# Patient Record
Sex: Male | Born: 1970 | Race: White | Hispanic: Yes | Smoking: Never smoker
Health system: Southern US, Community
[De-identification: ages and names within clinical notes are randomized; demographics above are authoritative.]

## PROBLEM LIST (undated history)

## (undated) DIAGNOSIS — F419 Anxiety disorder, unspecified: Secondary | ICD-10-CM

## (undated) DIAGNOSIS — T7840XA Allergy, unspecified, initial encounter: Secondary | ICD-10-CM

## (undated) DIAGNOSIS — K219 Gastro-esophageal reflux disease without esophagitis: Secondary | ICD-10-CM

## (undated) HISTORY — PX: UPPER GASTROINTESTINAL ENDOSCOPY: SHX188

## (undated) HISTORY — PX: FINGER SURGERY: SHX640

## (undated) HISTORY — DX: Allergy, unspecified, initial encounter: T78.40XA

## (undated) HISTORY — DX: Gastro-esophageal reflux disease without esophagitis: K21.9

## (undated) HISTORY — DX: Anxiety disorder, unspecified: F41.9

## (undated) HISTORY — PX: OTHER SURGICAL HISTORY: SHX169

---

## 2017-08-02 ENCOUNTER — Encounter: Payer: Self-pay | Admitting: Gastroenterology

## 2018-09-15 ENCOUNTER — Emergency Department (HOSPITAL_COMMUNITY): Payer: Managed Care, Other (non HMO)

## 2018-09-15 ENCOUNTER — Encounter (HOSPITAL_COMMUNITY): Payer: Self-pay | Admitting: Emergency Medicine

## 2018-09-15 ENCOUNTER — Emergency Department (HOSPITAL_COMMUNITY)
Admission: EM | Admit: 2018-09-15 | Discharge: 2018-09-15 | Disposition: A | Discharge status: Home / Self Care | Payer: Managed Care, Other (non HMO) | Attending: Emergency Medicine | Admitting: Emergency Medicine

## 2018-09-15 DIAGNOSIS — W231XXA Caught, crushed, jammed, or pinched between stationary objects, initial encounter: Secondary | ICD-10-CM | POA: Insufficient documentation

## 2018-09-15 DIAGNOSIS — Z23 Encounter for immunization: Secondary | ICD-10-CM | POA: Diagnosis not present

## 2018-09-15 DIAGNOSIS — Y929 Unspecified place or not applicable: Secondary | ICD-10-CM | POA: Diagnosis not present

## 2018-09-15 DIAGNOSIS — Y939 Activity, unspecified: Secondary | ICD-10-CM | POA: Insufficient documentation

## 2018-09-15 DIAGNOSIS — S60411A Abrasion of left index finger, initial encounter: Secondary | ICD-10-CM | POA: Insufficient documentation

## 2018-09-15 DIAGNOSIS — T148XXA Other injury of unspecified body region, initial encounter: Secondary | ICD-10-CM

## 2018-09-15 DIAGNOSIS — Y998 Other external cause status: Secondary | ICD-10-CM | POA: Insufficient documentation

## 2018-09-15 DIAGNOSIS — S6992XA Unspecified injury of left wrist, hand and finger(s), initial encounter: Secondary | ICD-10-CM | POA: Diagnosis present

## 2018-09-15 DIAGNOSIS — S60022A Contusion of left index finger without damage to nail, initial encounter: Secondary | ICD-10-CM | POA: Insufficient documentation

## 2018-09-15 MED ORDER — TETANUS-DIPHTH-ACELL PERTUSSIS 5-2.5-18.5 LF-MCG/0.5 IM SUSP
0.5000 mL | Freq: Once | INTRAMUSCULAR | Status: AC
  Administered 2018-09-15: 0.5 mL via INTRAMUSCULAR
  Filled 2018-09-15: qty 0.5

## 2018-09-15 NOTE — ED Triage Notes (Signed)
Pt states grill lid slammed down on L index finger just pta.

## 2018-09-15 NOTE — ED Provider Notes (Signed)
[facility] EMERGENCY DEPARTMENT Provider Note   CSN: [id number] Arrival date & time: 09/15/18  1536     History   Chief Complaint Chief Complaint  Patient presents with  . Finger Injury    HPI Benjamin Ho is a 48 y.o. male.  Patient is a 48 year old male who presents with a finger injury.  He states he slammed his left index finger in a grill lid just prior to arrival which was about an hour ago.  He has an abrasion to the finger and throbbing pain.  He feels like it is throbbing him and it feels a little numb because it swollen but denies any other numbness or weakness.  He is unsure when his last tetanus shot was.     History reviewed. No pertinent past medical history.  There are no active problems to display for this patient.   History reviewed. No pertinent surgical history.      Home Medications    Prior to Admission medications   Not on File    Family History No family history on file.  Social History Social History   Tobacco Use  . Smoking status: Not on file  Substance Use Topics  . Alcohol use: Not on file  . Drug use: Not on file     Allergies   Patient has no allergy information on record.   Review of Systems Review of Systems  Constitutional: Negative for fever.  Gastrointestinal: Negative for nausea and vomiting.  Musculoskeletal: Positive for arthralgias and joint swelling. Negative for back pain and neck pain.  Skin: Positive for wound.  Neurological: Negative for weakness, numbness and headaches.     Physical Exam Updated Vital Signs BP (!) 141/93 (BP Location: Right Arm)   Pulse 77   Temp 98.1 F (36.7 C) (Oral)   Resp 16   Ht 5\' 8"  (1.727 m)   Wt 79.8 kg   SpO2 96%   BMI 26.76 kg/m   Physical Exam Constitutional:      Appearance: He is well-developed.  HENT:     Head: Normocephalic and atraumatic.  Neck:     Musculoskeletal: Normal range of motion and neck supple.    Cardiovascular:     Rate and Rhythm: Normal rate.  Pulmonary:     Effort: Pulmonary effort is normal.  Musculoskeletal:        General: Tenderness present.     Comments: Patient has mild swelling to the distal part of the left index finger overlying the middle phalanx.  The injury is around the DIP joint.  He has a small abrasion on the volar surface of the finger and a small abrasion on the dorsal surface.  There is no active bleeding.  He is able to flex and extend the finger including the distal phalanx without difficulty.  He has some mildly diminished sensation to light touch to the pad of the finger.  Capillary refill is less than 2.  There is no apparent nail involvement.  Skin:    General: Skin is warm and dry.  Neurological:     Mental Status: He is alert and oriented to person, place, and time.      ED Treatments / Results  Labs (all labs ordered are listed, but only abnormal results are displayed) Labs Reviewed - No data to display  EKG None  Radiology Dg Finger Index Left  Result Date: 09/15/2018 CLINICAL DATA:  Laceration. EXAM: LEFT INDEX FINGER 2+V COMPARISON:  None. FINDINGS:  There is no evidence of fracture or dislocation. There is no evidence of arthropathy or other focal bone abnormality. Soft tissues are unremarkable. IMPRESSION: Negative. Electronically Signed   By: Dorise Bullion III M.D   On: 09/15/2018 17:48    Procedures Procedures (including critical care time)  Medications Ordered in ED Medications  Tdap (BOOSTRIX) injection 0.5 mL (0.5 mLs Intramuscular Given 09/15/18 1658)     Initial Impression / Assessment and Plan / ED Course  I have reviewed the triage vital signs and the nursing notes.  Pertinent labs & imaging results that were available during my care of the patient were reviewed by me and considered in my medical decision making (see chart for details).     Patient is a 48 year old male who presents with a finger injury.  He has some  small abrasions on both sides of his fingers with small superficial appearing laceration.  There is no bony injury noted on x-ray.  No nail injury.  He was given wound care instructions and advised to return if he has any worsening symptoms.  Final Clinical Impressions(s) / ED Diagnoses   Final diagnoses:  Contusion of left index finger without damage to nail, initial encounter  Abrasion    ED Discharge Orders    None       Malvin Johns, MD 09/15/18 1819

## 2018-09-15 NOTE — ED Notes (Signed)
Pt back from X-ray.  

## 2019-04-18 ENCOUNTER — Encounter: Payer: Self-pay | Admitting: Gastroenterology

## 2019-04-23 ENCOUNTER — Other Ambulatory Visit: Payer: Self-pay | Admitting: Internal Medicine

## 2019-04-23 DIAGNOSIS — E663 Overweight: Secondary | ICD-10-CM

## 2019-04-30 ENCOUNTER — Other Ambulatory Visit: Payer: Self-pay | Admitting: Internal Medicine

## 2019-04-30 DIAGNOSIS — E663 Overweight: Secondary | ICD-10-CM

## 2019-04-30 DIAGNOSIS — K219 Gastro-esophageal reflux disease without esophagitis: Secondary | ICD-10-CM

## 2019-04-30 DIAGNOSIS — K824 Cholesterolosis of gallbladder: Secondary | ICD-10-CM

## 2019-05-02 ENCOUNTER — Ambulatory Visit
Admission: RE | Admit: 2019-05-02 | Discharge: 2019-05-02 | Disposition: A | Discharge status: Home / Self Care | Payer: Managed Care, Other (non HMO) | Source: Ambulatory Visit | Attending: Internal Medicine | Admitting: Internal Medicine

## 2019-05-02 DIAGNOSIS — K219 Gastro-esophageal reflux disease without esophagitis: Secondary | ICD-10-CM

## 2019-05-02 DIAGNOSIS — K824 Cholesterolosis of gallbladder: Secondary | ICD-10-CM

## 2019-05-02 DIAGNOSIS — E663 Overweight: Secondary | ICD-10-CM

## 2019-05-19 ENCOUNTER — Other Ambulatory Visit: Payer: Self-pay

## 2019-05-19 ENCOUNTER — Ambulatory Visit: Payer: Managed Care, Other (non HMO) | Admitting: Gastroenterology

## 2019-05-19 ENCOUNTER — Encounter: Payer: Self-pay | Admitting: Gastroenterology

## 2019-05-19 VITALS — BP 120/90 | HR 80 | Temp 98.5°F | Ht 67.5 in | Wt 181.0 lb

## 2019-05-19 DIAGNOSIS — K824 Cholesterolosis of gallbladder: Secondary | ICD-10-CM

## 2019-05-19 DIAGNOSIS — K219 Gastro-esophageal reflux disease without esophagitis: Secondary | ICD-10-CM

## 2019-05-19 NOTE — Progress Notes (Signed)
Referring Provider: Sueanne Margarita, DO Primary Care Physician:  Sueanne Margarita, DO  Reason for Consultation:  GERD   IMPRESSION:  GERD controlled on PPI therapy Gallbladder polyps History of H. pylori 2011 No prior colon cancer screening  Reflux is adequately controlled on PPI therapy but the patient would like to avoid medications over time.  We briefly discussed the possibility of a transoral incision less fundoplication (TIF).  He would like to obtain additional information on I will arrange referral with Dr. Bryan Lemma.  May need to consider additional evaluation of his reflux prior to that procedure.  No upper endoscopy, pH testing, manometry, or barium esophagram planned at this time as it would not change my management unless TIF becomes a possibility.  Although his known gallbladder polyp appeared unchanged for several years, on recent testing it is slightly larger than it was last year which was even slightly larger than in 2018.  We will repeat an ultrasound in 1 year.  Colonoscopy recommended for colon cancer screening given his age over 18.  We discussed the procedure in depth including risks, benefits, and alternatives.  He specifically asked about Cologuard.  He will discuss cost with his insurance company.  I have told him he could call to schedule the procedure at any time.  PLAN: Prevacid 15 mg daily (3 month supply with 3 refills) Reviewed reflux lifestyles Referral to Dr. Bryan Lemma to discuss transoral incisionless fundoplication (TIF) Repeat abdominal ultrasound in October 2021 Consider screening colonoscopy Return appointment in one year, or earlier as needed   Please see the "Patient Instructions" section for addition details about the plan.  HPI: Benjamin Ho is a 48 y.o. male referred by Dr. Francesco Sor for gallbladder polyp and reflux. Originally from [place].  History is obtained to the patient, review of his electronic health record, and records  that we obtained from his prior gastroenterologist in [place].  He has a history of migraines, anixety, and low back pain.  Identified with a gallbladder polyp 9 years ago in [place], [place]. Continued to have surveillance of the polyp after a move to [place]. Relocated to [place] in 2019. And he is working to get established with a gastroenterologist locally.  He has no symptoms that he attributes to the polyp.  Longstanding history of Prevacid OTC for many years with adequate control of her heartburn.  Initially presented with epigastric pain.  In 2011 he required both Prevacid and Zantac for symptom escalation after an upper endoscopy showed reflux and H. pylori.  Nexium and Zantac monotherapy did not work as well for his reflux. Without medications he will have heartburn and significant coughing.  He has wanted to discontinue the medication. No evidence for GI bleeding, iron deficiency anemia, anorexia, unexplained weight loss, dysphagia, odynophagia, persistent vomiting, or gastrointestinal cancer in a first-degree relative.  No chronic cough, laryngeal disease, or asthma.  Has significant stress at work and he develops a raspy throat and the sensation of broken glass. Resolves with Tums and deep breathing.    H pylori stool antigen submitted last week. Results are not available.   Abdominal imaging: Abdominal ultrasound 03/07/2012: Normal no gallbladder polyps Abdominal ultrasound 2/12: 2 small gallbladder polyps Abdominal ultrasound 11/2013: 2 small gallbladder polyps Abdominal ultrasound 07/2015: 2 small gallbladder polyps Abdominal ultrasound 07/2016: 2 mm gallbladder polyp Abdominal ultrasound 08/02/2017: 3 mm gallbladder polyp and mild splenomegaly Abdominal ultrasound 05/02/19: 33mm gallbladder polyp, no acute abnormalities  Prior endoscopic history: EGD in 2011 in [place], [place]  for reflux. Diagnosed with H pylori.  No known family history of colon cancer or polyps.  No family history of uterine/endometrial cancer, pancreatic cancer or gastric/stomach cancer.   Past Medical History:  Diagnosis Date  . Anxiety     Past Surgical History:  Procedure Laterality Date  . mallet finger     pinky right hand    Current Outpatient Medications  Medication Sig Dispense Refill  . fluticasone (FLONASE) 50 MCG/ACT nasal spray Place 1 spray into both nostrils at bedtime.    . Lansoprazole (PREVACID 24HR PO) Take 1 tablet by mouth daily.    . vitamin B-12 (CYANOCOBALAMIN) 1000 MCG tablet Take 1,000 mcg by mouth daily.     No current facility-administered medications for this visit.     Allergies as of 05/19/2019 - Review Complete 05/19/2019  Allergen Reaction Noted  . Cefadroxil Swelling and Rash 05/19/2019    Family History  Problem Relation Age of Onset  . Diabetes Mother   . Hypertension Father   . Thyroid disease Sister   . Lung cancer Maternal Grandfather        smoker  . Cancer Paternal Grandfather        stomach or intestinal    Social History   Socioeconomic History  . Marital status: Significant Other    Spouse name: Not on file  . Number of children: 0  . Years of education: Not on file  . Highest education level: Not on file  Occupational History  . Occupation: Engineer, materials  . Financial resource strain: Not on file  . Food insecurity    Worry: Not on file    Inability: Not on file  . Transportation needs    Medical: Not on file    Non-medical: Not on file  Tobacco Use  . Smoking status: Never Smoker  . Smokeless tobacco: Never Used  Substance and Sexual Activity  . Alcohol use: Never    Frequency: Never  . Drug use: Never  . Sexual activity: Not on file  Lifestyle  . Physical activity    Days per week: Not on file    Minutes per session: Not on file  . Stress: Not on file  Relationships  . Social Herbalist on phone: Not on file    Gets together: Not on file    Attends religious service: Not  on file    Active member of club or organization: Not on file    Attends meetings of clubs or organizations: Not on file    Relationship status: Not on file  . Intimate partner violence    Fear of current or ex partner: Not on file    Emotionally abused: Not on file    Physically abused: Not on file    Forced sexual activity: Not on file  Other Topics Concern  . Not on file  Social History Narrative  . Not on file    Review of Systems: 12 system ROS is negative except as noted above.   Physical Exam: General:   Alert,  well-nourished, pleasant and cooperative in NAD Head:  Normocephalic and atraumatic. Eyes:  Sclera clear, no icterus.   Conjunctiva pink. Ears:  Normal auditory acuity. Nose:  No deformity, discharge,  or lesions. Mouth:  No deformity or lesions.   Neck:  Supple; no masses or thyromegaly. Lungs:  Clear throughout to auscultation.   No wheezes. Heart:  Regular rate and rhythm; no murmurs. Abdomen:  Soft, thin, nontender,  nondistended, normal bowel sounds, no rebound or guarding. No hepatosplenomegaly.   Rectal:  Deferred  Msk:  Symmetrical. No boney deformities LAD: No inguinal or umbilical LAD Extremities:  No clubbing or edema. Neurologic:  Alert and  oriented x4;  grossly nonfocal Skin:  Intact without significant lesions or rashes. Psych:  Alert and cooperative. Normal mood and affect.     Vinessa Macconnell L. Tarri Glenn, MD, MPH 05/19/2019, 9:18 AM

## 2019-05-19 NOTE — Patient Instructions (Addendum)
Take your Prevacid 30 minutes before meals Avoid any dietary triggers Avoid spicy and acidic foods Limit your intake of coffee, tea, alcohol, and carbonated drinks Work to maintain a healthy weight Keep the head of the bed elevated with blocks if you are having any nighttime symptoms Stay upright for 2 hours after eating Avoid meals and snacks three to four hours before bedtime   I would like for you to discuss TIF with Dr. Melanee Left. I gave you a brochure about the procedure today.  You should plan to have an ultrasound in one year to follow-up on the gallbladder polyp.  I currently recommend colon cancer screening starting at age 47. Please talk to your insurance company to see if this is approved and how much it would cost. Call at any time to schedule the procedure.   We will plan to follow-up after the ultrasound in one year. Please call me with any questions or concerns in the meantime.

## 2019-06-19 ENCOUNTER — Ambulatory Visit: Payer: Managed Care, Other (non HMO) | Admitting: Gastroenterology

## 2019-06-23 ENCOUNTER — Telehealth (INDEPENDENT_AMBULATORY_CARE_PROVIDER_SITE_OTHER): Payer: Managed Care, Other (non HMO) | Admitting: Gastroenterology

## 2019-06-23 ENCOUNTER — Other Ambulatory Visit: Payer: Self-pay

## 2019-06-23 VITALS — Ht 68.0 in | Wt 180.0 lb

## 2019-06-23 DIAGNOSIS — Z1159 Encounter for screening for other viral diseases: Secondary | ICD-10-CM

## 2019-06-23 DIAGNOSIS — K824 Cholesterolosis of gallbladder: Secondary | ICD-10-CM

## 2019-06-23 DIAGNOSIS — Z1211 Encounter for screening for malignant neoplasm of colon: Secondary | ICD-10-CM

## 2019-06-23 DIAGNOSIS — K219 Gastro-esophageal reflux disease without esophagitis: Secondary | ICD-10-CM

## 2019-06-23 NOTE — Patient Instructions (Signed)
It has been recommended to you by your physician that you have a(n) upper endoscopy and colonoscopy completed. Per your request, we did not schedule the procedure(s) today. Please contact our office at [phone] should you decide to have the procedure completed. You will be scheduled for a pre-visit and procedure at that time.  Follow up in 3-6 months  It was a pleasure to see you today!  Vito Cirigliano, D.O.

## 2019-06-23 NOTE — Progress Notes (Signed)
Chief Complaint: GERD, evaluation for Transoral Incisionless Fundoplication (TIF)  Referring Provider:    Thornton Park, MD   HPI:    Due to current restrictions/limitations of in-office visits due to the COVID-19 pandemic, this scheduled clinical appointment was converted to a telehealth virtual consultation using [employer].  -Time of medical discussion: 28 minutes -The patient did consent to this virtual visit and is aware of possible charges through their insurance for this visit.  -Names of all parties present: Benjamin Ho (patient), Gerrit Heck, DO, [facility] (physician) -Patient location: Home -Physician location: Office  Benjamin Ho is a 48 y.o. male referred to me by Dr. Tarri Glenn for evaluation of possible antireflux intervention with Transoral Incisionless Fundoplication (TIF) with a goal to stop or significantly reduce acid suppression therapy.  Reflux symptoms adequately controlled with PPI therapy, but he would like to avoid medications due to ADR profile.  Previously followed with GI in [place], for GERD, with EGD in 2011 as below. Treated for H pylori at that time. Has been taking Prevacid since then, which initially worked well, but lost efficacy. No improvement with Nexium, Zantac, and has since reverted back to Prevacid QAM pre-breakfast. Generally works well, and takes Tums prn for breakthrough sxs (usually d/t dietary indiscretions), but wants to stop medications. Stopped coffee 10 years ago d/t reflux, and all caffeine 3 months ago. Never smoker/never drinker. Worse w/ chocolate, occasionally tomato based sauces. Rare nocturnal sxs.   GERD history: -Index symptoms: Heartburn, regurgitation, waterbrash, cough, raspy voice; occasional globus sensation; no dysphagia -Onset: Symptoms present since at least 2011 -Medications trialed: Prevacid OTC, Zantac, Nexium, Tums -Current medications: Prevacid 50 mg/day, Tums prn  -Complications:   GERD evaluation: -EGD: 2011 ([place]) -reported reflux changes and H. Pylori. No report in EMR -Barium esophagram: None -Esophageal Manometry: None -pH/Impedance: None   He separately has a history of asymptomatic GB polyp, first diagnosed 9 years ago in [place].  Continues to have surveillance of the polyp after he moved to [place].  Relocated to [facility] in 2019, and repeat ultrasound with GB polyp slightly enlarged.  Follows for this with Dr. Tarri Glenn, with plan for repeat abdominal ultrasound in 2021.  Abdominal imaging: Abdominal ultrasound 03/07/2012: Normal no gallbladder polyps Abdominal ultrasound 2/12: 2 small gallbladder polyps Abdominal ultrasound 11/2013: 2 small gallbladder polyps Abdominal ultrasound 07/2015: 2 small gallbladder polyps Abdominal ultrasound 07/2016: 2 mm gallbladder polyp Abdominal ultrasound 08/02/2017: 3 mm gallbladder polyp and mild splenomegaly Abdominal ultrasound 05/02/19: 36mm gallbladder polyp, no acute abnormalities  No prior colonoscopy.  Past medical history, past surgical history, social history, family history, medications, and allergies reviewed in the chart and with patient.    Past Medical History:  Diagnosis Date  . Anxiety      Past Surgical History:  Procedure Laterality Date  . mallet finger     pinky right hand   Family History  Problem Relation Age of Onset  . Diabetes Mother   . Hypertension Father   . Thyroid disease Sister   . Lung cancer Maternal Grandfather        smoker  . Cancer Paternal Grandfather        stomach or intestinal  . Cancer Cousin        intestinal   . Esophageal cancer Neg Hx    Social History   Tobacco Use  . Smoking status: Never Smoker  . Smokeless tobacco: Never  Used  Substance Use Topics  . Alcohol use: Never    Frequency: Never  . Drug use: Never   Current Outpatient Medications  Medication Sig Dispense Refill  . fluticasone (FLONASE) 50 MCG/ACT  nasal spray Place 1 spray into both nostrils at bedtime.    . Lansoprazole (PREVACID 24HR PO) Take 1 tablet by mouth daily.    . vitamin B-12 (CYANOCOBALAMIN) 1000 MCG tablet Take 1,000 mcg by mouth daily.     No current facility-administered medications for this visit.    Allergies  Allergen Reactions  . Cefadroxil Swelling and Rash     Review of Systems: All systems reviewed and negative except where noted in HPI.     Physical Exam:    Complete physical exam not completed due to the nature of this telehealth communication.   Gen: Awake, alert, and oriented, and well communicative. HEENT: EOMI, non-icteric sclera, NCAT, MMM Neck: Normal movement of head and neck Pulm: No labored breathing, speaking in full sentences without conversational dyspnea Derm: No apparent lesions or bruising in visible field MS: Moves all visible extremities without noticeable abnormality Psych: Pleasant, cooperative, normal speech, thought processing seemingly intact   ASSESSMENT AND PLAN;   1) GERD Benjamin Ho is a 48 y.o. male referred to me to discuss GERD and treatment options. Discussed the pathophysiology of GERD at length, to include the risks of untreated reflux (ie, strictures, Barrett's Esophagus, EAC, etc) as well as the possible treatment with medications vs antireflux surgery. In particular, we discussed the risks, benefits, and alternatives of Transoral Incisionless Fundoplication (TIF), to include Nissen fundoplication and Linx, and the patient wishes to proceed with the pre-operative evaluation for TIF. Will proceed as below:   - EGD to evaluate for objective e/o reflux (ie, reflux esophagitis, SSND Barrett's, etc) and r/o contraindications of TIF (ie, large hiatal hernia, Hill classification 3 or higher, LA Grade C or D esophagitis, EoE, etc)  - Resume acid suppression therapy for now  - If necessary, may need to refer for pH/MII testing vs barium esophagram -  Directed patient to informative video regarding the TIF procedure at [email or web address]  - To follow-up with me in the GI clinic following evaluation as above to discuss potential TIF as indicated vs alternate long term treatment strategies - All questions answered  - Will obtain recent lab results from Dr. Luanna Salk office, specifically BMP and any micronutrient evaluation given chronic acid suppression therapy -Previous request already made for EGD records from [facility]ologist  2) CRC screening: -Due to newly released societal guidelines, CRC screening now recommended at age 47.  Given plan for upper endoscopy as above, will plan on colonoscopy same day for routine, age-appropriate, average risk CRC screening.  3) History of GB polyp: -Follows with Dr. Tarri Glenn.  Plan for repeat ultrasound in 2021.  The indications, risks, and benefits of EGD and colonoscopy were explained to the patient in detail. Risks include but are not limited to bleeding, perforation, adverse reaction to medications, and cardiopulmonary compromise. Sequelae include but are not limited to the possibility of surgery, hositalization, and mortality. The patient verbalized understanding and wished to proceed. All questions answered, referred to scheduler and bowel prep ordered. Further recommendations pending results of the exam.    Lavena Bullion, DO, FACG  06/23/2019, 8:58 AM   Sueanne Margarita, DO

## 2019-08-04 ENCOUNTER — Telehealth: Payer: Self-pay

## 2019-08-04 NOTE — Telephone Encounter (Signed)
-----   Message from Angie Fava, LPN sent at [phone]  2:50 PM EST ----- Regarding: EGD colon ECL in [facility]

## 2019-08-04 NOTE — Telephone Encounter (Signed)
I have been unable to reach patient by phone to schedule a colonoscopy/EGD. A letter was mailed out for patient to contact the office.

## 2019-10-06 ENCOUNTER — Other Ambulatory Visit: Payer: Self-pay

## 2019-10-06 ENCOUNTER — Ambulatory Visit (AMBULATORY_SURGERY_CENTER): Payer: Self-pay

## 2019-10-06 VITALS — Temp 97.1°F | Ht 67.5 in | Wt 183.0 lb

## 2019-10-06 DIAGNOSIS — K219 Gastro-esophageal reflux disease without esophagitis: Secondary | ICD-10-CM

## 2019-10-06 DIAGNOSIS — Z01818 Encounter for other preprocedural examination: Secondary | ICD-10-CM

## 2019-10-06 DIAGNOSIS — Z1211 Encounter for screening for malignant neoplasm of colon: Secondary | ICD-10-CM

## 2019-10-06 MED ORDER — NA SULFATE-K SULFATE-MG SULF 17.5-3.13-1.6 GM/177ML PO SOLN: 1.0000 | Freq: Once | ORAL | 0 refills | Status: AC

## 2019-10-06 NOTE — Progress Notes (Signed)

## 2019-10-16 ENCOUNTER — Encounter: Payer: Self-pay | Admitting: Gastroenterology

## 2019-10-16 ENCOUNTER — Ambulatory Visit (INDEPENDENT_AMBULATORY_CARE_PROVIDER_SITE_OTHER): Payer: Managed Care, Other (non HMO)

## 2019-10-16 ENCOUNTER — Other Ambulatory Visit: Payer: Self-pay | Admitting: Gastroenterology

## 2019-10-16 DIAGNOSIS — Z1159 Encounter for screening for other viral diseases: Secondary | ICD-10-CM

## 2019-10-17 LAB — SARS CORONAVIRUS 2 (TAT 6-24 HRS): SARS Coronavirus 2: NEGATIVE

## 2019-10-20 ENCOUNTER — Other Ambulatory Visit: Payer: Self-pay

## 2019-10-20 ENCOUNTER — Encounter: Payer: Self-pay | Admitting: Gastroenterology

## 2019-10-20 ENCOUNTER — Ambulatory Visit (AMBULATORY_SURGERY_CENTER): Payer: Managed Care, Other (non HMO) | Admitting: Gastroenterology

## 2019-10-20 VITALS — BP 110/65 | HR 58 | Temp 96.8°F | Resp 14 | Ht 67.5 in | Wt 183.0 lb

## 2019-10-20 DIAGNOSIS — Z1211 Encounter for screening for malignant neoplasm of colon: Secondary | ICD-10-CM

## 2019-10-20 DIAGNOSIS — D123 Benign neoplasm of transverse colon: Secondary | ICD-10-CM

## 2019-10-20 DIAGNOSIS — K635 Polyp of colon: Secondary | ICD-10-CM | POA: Diagnosis not present

## 2019-10-20 DIAGNOSIS — D128 Benign neoplasm of rectum: Secondary | ICD-10-CM

## 2019-10-20 DIAGNOSIS — K621 Rectal polyp: Secondary | ICD-10-CM

## 2019-10-20 DIAGNOSIS — K640 First degree hemorrhoids: Secondary | ICD-10-CM

## 2019-10-20 DIAGNOSIS — K2970 Gastritis, unspecified, without bleeding: Secondary | ICD-10-CM

## 2019-10-20 DIAGNOSIS — K219 Gastro-esophageal reflux disease without esophagitis: Secondary | ICD-10-CM

## 2019-10-20 DIAGNOSIS — K2950 Unspecified chronic gastritis without bleeding: Secondary | ICD-10-CM

## 2019-10-20 DIAGNOSIS — K2990 Gastroduodenitis, unspecified, without bleeding: Secondary | ICD-10-CM

## 2019-10-20 MED ORDER — SODIUM CHLORIDE 0.9 % IV SOLN: 500.0000 mL | Freq: Once | INTRAVENOUS | Status: DC

## 2019-10-20 NOTE — Op Note (Signed)
[facility] Patient Name: Benjamin Ho Procedure Date: 10/20/2019 7:26 AM MRN: [id number] Endoscopist: Gerrit Heck , MD Age: 49 Referring MD:  Date of Birth: [birth year] Gender: Male Account #: [id number] Procedure:                Upper GI endoscopy Indications:              Heartburn, Esophageal reflux, Preoperative                            assessment for antireflux procedure Medicines:                Monitored Anesthesia Care Procedure:                Pre-Anesthesia Assessment:                           - Prior to the procedure, a History and Physical                            was performed, and patient medications and                            allergies were reviewed. The patient's tolerance of                            previous anesthesia was also reviewed. The risks                            and benefits of the procedure and the sedation                            options and risks were discussed with the patient.                            All questions were answered, and informed consent                            was obtained. Prior Anticoagulants: The patient has                            taken no previous anticoagulant or antiplatelet                            agents. ASA Grade Assessment: II - A patient with                            mild systemic disease. After reviewing the risks                            and benefits, the patient was deemed in                            satisfactory condition to undergo the procedure.  After obtaining informed consent, the endoscope was                            passed under direct vision. Throughout the                            procedure, the patient's blood pressure, pulse, and                            oxygen saturations were monitored continuously. The                            Endoscope was introduced through the mouth, and                            advanced to the  second part of duodenum. The upper                            GI endoscopy was accomplished without difficulty.                            The patient tolerated the procedure well. Scope In: Scope Out: Findings:                 The examined esophagus was normal.                           The Z-line was regular and was found 38 cm from the                            incisors.                           The gastroesophageal flap valve was visualized                            endoscopically and classified as Hill Grade III                            (minimal fold, loose to endoscope, hiatal hernia                            likely) with LES laxity noted on anterograde and                            retroflexed views.                           Scattered mild inflammation characterized by                            erythema was found in the gastric fundus, in the                            gastric body and in the gastric antrum.  Biopsies                            were taken with a cold forceps for Helicobacter                            pylori testing. Estimated blood loss was minimal.                           The duodenal bulb, first portion of the duodenum                            and second portion of the duodenum were normal. Complications:            No immediate complications. Estimated Blood Loss:     Estimated blood loss was minimal. Impression:               - Normal esophagus.                           - Z-line regular, 38 cm from the incisors.                           - Gastroesophageal flap valve classified as Hill                            Grade III (minimal fold, loose to endoscope, hiatal                            hernia likely).                           - Gastritis. Biopsied.                           - Normal duodenal bulb, first portion of the                            duodenum and second portion of the duodenum. Recommendation:           - Patient has a contact  number available for                            emergencies. The signs and symptoms of potential                            delayed complications were discussed with the                            patient. Return to normal activities tomorrow.                            Written discharge instructions were provided to the                            patient.                           -  Resume previous diet.                           - Continue present medications.                           - Await pathology results.                           - Perform an esophagram at appointment to be                            scheduled.                           - Return to GI clinic after studies are complete.                           - Given the degree of LES laxity and Hill Grade 3                            valve, if planning on antireflux surgery, recommend                            combined lap hiatal hernia repair/crural repair and                            Transoral Incisionless Fundoplication, ie cTIF. Gerrit Heck, MD 10/20/2019 8:40:06 AM

## 2019-10-20 NOTE — Progress Notes (Signed)
Called to room to assist during endoscopic procedure.  Patient ID and intended procedure confirmed with present staff. Received instructions for my participation in the procedure from the performing physician.  

## 2019-10-20 NOTE — Op Note (Signed)
[facility] Patient Name: Benjamin Ho Procedure Date: 10/20/2019 7:25 AM MRN: [id number] Endoscopist: Gerrit Heck , MD Age: 49 Referring MD:  Date of Birth: 21-May-1971 Gender: Male Account #: [id number] Procedure:                Colonoscopy Indications:              Screening for colorectal malignant neoplasm, This                            is the patient's first colonoscopy Medicines:                Monitored Anesthesia Care Procedure:                Pre-Anesthesia Assessment:                           - Prior to the procedure, a History and Physical                            was performed, and patient medications and                            allergies were reviewed. The patient's tolerance of                            previous anesthesia was also reviewed. The risks                            and benefits of the procedure and the sedation                            options and risks were discussed with the patient.                            All questions were answered, and informed consent                            was obtained. Prior Anticoagulants: The patient has                            taken no previous anticoagulant or antiplatelet                            agents. ASA Grade Assessment: II - A patient with                            mild systemic disease. After reviewing the risks                            and benefits, the patient was deemed in                            satisfactory condition to undergo the procedure.  After obtaining informed consent, the colonoscope                            was passed under direct vision. Throughout the                            procedure, the patient's blood pressure, pulse, and                            oxygen saturations were monitored continuously. The                            Colonoscope was introduced through the anus and                            advanced to the the  terminal ileum. The colonoscopy                            was performed without difficulty. The patient                            tolerated the procedure well. The quality of the                            bowel preparation was excellent. The terminal                            ileum, ileocecal valve, appendiceal orifice, and                            rectum were photographed. Scope In: 8:14:38 AM Scope Out: 8:29:13 AM Scope Withdrawal Time: 0 hours 13 minutes 28 seconds  Total Procedure Duration: 0 hours 14 minutes 35 seconds  Findings:                 The perianal and digital rectal examinations were                            normal.                           A 6 mm polyp was found in the transverse colon. The                            polyp was sessile. The polyp was removed with a                            cold snare. Resection and retrieval were complete.                            Estimated blood loss was minimal.                           One 4 mm mucosal nodule was found in the distal  rectum. Tunnelled biopsies were taken via                            bite-on-bite technique with a cold forceps for                            histology. Estimated blood loss was minimal.                           Non-bleeding internal hemorrhoids were found during                            retroflexion. The hemorrhoids were small.                           Retroflexion in the right colon was performed.                           The exam was otherwise normal throughout the                            examined colon.                           The terminal ileum appeared normal. Complications:            No immediate complications. Estimated Blood Loss:     Estimated blood loss was minimal. Impression:               - One 6 mm polyp in the transverse colon, removed                            with a cold snare. Resected and retrieved.                           -  Mucosal nodule in the distal rectum. Biopsied.                           - Non-bleeding internal hemorrhoids.                           - The examined portion of the ileum was normal. Recommendation:           - Patient has a contact number available for                            emergencies. The signs and symptoms of potential                            delayed complications were discussed with the                            patient. Return to normal activities tomorrow.                            Written discharge instructions were provided  to the                            patient.                           - Resume previous diet.                           - Continue present medications.                           - Await pathology results.                           - Repeat colonoscopy for surveillance based on                            pathology results.                           - Depending on results of the rectal nodule biopsy,                            may need to further evaluate with Endoscopic                            Ultrasound as appropriate. Gerrit Heck, MD 10/20/2019 8:44:08 AM

## 2019-10-20 NOTE — Progress Notes (Signed)
Temp by JB Vitals by CW  Pt's states no medical or surgical changes since previsit or office visit.  

## 2019-10-20 NOTE — Progress Notes (Signed)
Report given to PACU, vss 

## 2019-10-20 NOTE — Patient Instructions (Signed)
Information on polyps given to you today.  Await pathology results.  Perform esophagram at appointment  to be scheduled.  Return to GI clinic after studies are complete.  YOU HAD AN ENDOSCOPIC PROCEDURE TODAY AT [facility]:   Refer to the procedure report that was given to you for any specific questions about what was found during the examination.  If the procedure report does not answer your questions, please call your gastroenterologist to clarify.  If you requested that your care partner not be given the details of your procedure findings, then the procedure report has been included in a sealed envelope for you to review at your convenience later.  YOU SHOULD EXPECT: Some feelings of bloating in the abdomen. Passage of more gas than usual.  Walking can help get rid of the air that was put into your GI tract during the procedure and reduce the bloating. If you had a lower endoscopy (such as a colonoscopy or flexible sigmoidoscopy) you may notice spotting of blood in your stool or on the toilet paper. If you underwent a bowel prep for your procedure, you may not have a normal bowel movement for a few days.  Please Note:  You might notice some irritation and congestion in your nose or some drainage.  This is from the oxygen used during your procedure.  There is no need for concern and it should clear up in a day or so.  SYMPTOMS TO REPORT IMMEDIATELY:   Following lower endoscopy (colonoscopy or flexible sigmoidoscopy):  Excessive amounts of blood in the stool  Significant tenderness or worsening of abdominal pains  Swelling of the abdomen that is new, acute  Fever of 100F or higher   Following upper endoscopy (EGD)  Vomiting of blood or coffee ground material  New chest pain or pain under the shoulder blades  Painful or persistently difficult swallowing  New shortness of breath  Fever of 100F or higher  Black, tarry-looking stools  For urgent or emergent issues, a  gastroenterologist can be reached at any hour by calling [phone]. Do not use MyChart messaging for urgent concerns.    DIET:  We do recommend a small meal at first, but then you may proceed to your regular diet.  Drink plenty of fluids but you should avoid alcoholic beverages for 24 hours.  ACTIVITY:  You should plan to take it easy for the rest of today and you should NOT DRIVE or use heavy machinery until tomorrow (because of the sedation medicines used during the test).    FOLLOW UP: Our staff will call the number listed on your records 48-72 hours following your procedure to check on you and address any questions or concerns that you may have regarding the information given to you following your procedure. If we do not reach you, we will leave a message.  We will attempt to reach you two times.  During this call, we will ask if you have developed any symptoms of COVID 19. If you develop any symptoms (ie: fever, flu-like symptoms, shortness of breath, cough etc.) before then, please call [phone].  If you test positive for Covid 19 in the 2 weeks post procedure, please call and report this information to [place].    If any biopsies were taken you will be contacted by phone or by letter within the next 1-3 weeks.  Please call us at [phone] if you have not heard about the biopsies in 3 weeks.    SIGNATURES/CONFIDENTIALITY:  You and/or your care partner have signed paperwork which will be entered into your electronic medical record.  These signatures attest to the fact that that the information above on your After Visit Summary has been reviewed and is understood.  Full responsibility of the confidentiality of this discharge information lies with you and/or your care-partner.

## 2019-10-21 ENCOUNTER — Other Ambulatory Visit: Payer: Self-pay

## 2019-10-21 DIAGNOSIS — K219 Gastro-esophageal reflux disease without esophagitis: Secondary | ICD-10-CM

## 2019-10-21 DIAGNOSIS — R12 Heartburn: Secondary | ICD-10-CM

## 2019-10-21 NOTE — Progress Notes (Signed)
Per EGD/colon procedure report- the patient is to be scheduled for an esophagram - Patient has been scheduled at [facility] on 03/26 /2021 arrival 0845, appt at 0900, NPO after 0600; patient given number to call [phone] -in case of need to reschedule or cancel appt; Patient advised to call back to the office at [phone] should questions/concerns arise;  Patient verbalized understanding of information/instructions;

## 2019-10-22 ENCOUNTER — Telehealth: Payer: Self-pay | Admitting: *Deleted

## 2019-10-22 NOTE — Telephone Encounter (Signed)
1. Have you developed a fever since your procedure?  no  2.   Have you had an respiratory symptoms (SOB or cough) since your procedure? no  3.   Have you tested positive for COVID 19 since your procedure no  4.   Have you had any family members/close contacts diagnosed with the COVID 19 since your procedure?  no   If yes to any of these questions please route to Joylene John, RN and Erenest Rasher, RN Follow up Call-  Call back number 10/20/2019  Post procedure Call Back phone  # [phone]  Permission to leave phone message Yes     Patient questions:  Do you have a fever, pain , or abdominal swelling? No. Pain Score  0 *  Have you tolerated food without any problems? Yes.    Have you been able to return to your normal activities? Yes.    Do you have any questions about your discharge instructions: Diet   No. Medications  No. Follow up visit  No.  Do you have questions or concerns about your Care? No.  Actions: * If pain score is 4 or above: No action needed, pain <4.

## 2019-10-22 NOTE — Telephone Encounter (Signed)
Message left on f/u call °

## 2019-10-24 ENCOUNTER — Other Ambulatory Visit: Payer: Self-pay

## 2019-10-24 ENCOUNTER — Other Ambulatory Visit: Payer: Self-pay | Admitting: Gastroenterology

## 2019-10-24 ENCOUNTER — Ambulatory Visit (HOSPITAL_COMMUNITY)
Admission: RE | Admit: 2019-10-24 | Discharge: 2019-10-24 | Disposition: A | Discharge status: Home / Self Care | Payer: Managed Care, Other (non HMO) | Source: Ambulatory Visit | Attending: Gastroenterology | Admitting: Gastroenterology

## 2019-10-24 DIAGNOSIS — K219 Gastro-esophageal reflux disease without esophagitis: Secondary | ICD-10-CM

## 2019-10-24 DIAGNOSIS — R12 Heartburn: Secondary | ICD-10-CM

## 2019-10-28 ENCOUNTER — Encounter: Payer: Self-pay | Admitting: Gastroenterology

## 2019-11-12 ENCOUNTER — Telehealth: Payer: Self-pay | Admitting: Gastroenterology

## 2019-11-12 NOTE — Telephone Encounter (Signed)
Patient is requesting results from patho report-letter resent to patient at this time per his request; Patient advised to call back to the office at [phone] should questions/concerns arise;  Patient verbalized understanding of information/instructions;

## 2019-11-20 ENCOUNTER — Encounter: Payer: Self-pay | Admitting: Gastroenterology

## 2019-11-20 ENCOUNTER — Ambulatory Visit (INDEPENDENT_AMBULATORY_CARE_PROVIDER_SITE_OTHER): Payer: Managed Care, Other (non HMO) | Admitting: Gastroenterology

## 2019-11-20 ENCOUNTER — Other Ambulatory Visit: Payer: Self-pay

## 2019-11-20 VITALS — BP 118/80 | HR 57 | Temp 96.9°F | Ht 68.5 in | Wt 186.1 lb

## 2019-11-20 DIAGNOSIS — K6289 Other specified diseases of anus and rectum: Secondary | ICD-10-CM

## 2019-11-20 DIAGNOSIS — K824 Cholesterolosis of gallbladder: Secondary | ICD-10-CM

## 2019-11-20 DIAGNOSIS — K219 Gastro-esophageal reflux disease without esophagitis: Secondary | ICD-10-CM

## 2019-11-20 DIAGNOSIS — Z86010 Personal history of colonic polyps: Secondary | ICD-10-CM | POA: Diagnosis not present

## 2019-11-20 DIAGNOSIS — K640 First degree hemorrhoids: Secondary | ICD-10-CM | POA: Diagnosis not present

## 2019-11-20 NOTE — Patient Instructions (Signed)
We have sent a referral to [facility] at [address] [place], [place] [address]. [phone]. You will be receiving a call to schedule an appointment with them soon. To see Dr Redmond Pulling  We will contact you to schedule you for an esophageal manometry you will need to be off your PPR for 7 days prior  Our office will contact you to schedule a lower EUS with Dr Rush Landmark or Dr Ardis Hughs.  It was a pleasure to see you today!  Vito Cirigliano, D.O.

## 2019-11-20 NOTE — Progress Notes (Signed)
P  Chief Complaint:    GERD, discuss antireflux surgical options (TIF), H. pylori follow-up  GI History: 49 year old male with a longstanding history of GERD, initially referred to me in 06/2019 by Dr. Tarri Glenn for consideration of TIF.  Previously followed with GI in [place], for GERD, with EGD in 2011 as below. Treated for H pylori at that time. Has been taking Prevacid since then, which initially worked well, but lost efficacy. No improvement with Nexium, Zantac, and has since reverted back to Prevacid QAM pre-breakfast. Generally works well, and takes Tums prn for breakthrough sxs (usually d/t dietary indiscretions), but wants to stop medications. Stopped coffee 10 years ago d/t reflux, and all caffeine in 2020. Never smoker/never drinker. Worse w/ chocolate, occasionally tomato based sauces. Rare nocturnal sxs.   GERD history: -Index symptoms: Heartburn, regurgitation, waterbrash, cough, raspy voice; occasional globus sensation; no dysphagia -Onset: Symptoms present since at least 2011 -Medications trialed: Prevacid OTC, Zantac, Nexium, Tums -Current medications: Prevacid 50 mg/day, Tums prn -Complications:   GERD evaluation: -EGD (09/2019, Dr. Bryan Lemma): Irregular Z-line, Hill grade 3 valve, mild gastritis -EGD: 2011 ([place]) -reported reflux changes and H. Pylori. No report in EMR -Barium esophagram (10/24/2019): Thickening esophageal folds c/w reflux, normal motility.  No hernia appreciated.  No reflux observed during study. -Esophageal Manometry:  Ordered today -pH/Impedance:  Ordered today  He separately has a history of asymptomatic GB polyp, first diagnosed 9 years ago in [place].  Continues to have surveillance of the polyp after he moved to [place].  Relocated to [facility] in 2019, and repeat ultrasound with GB polyp slightly enlarged.  Previously followed for this with Dr. Tarri Glenn, with plan for repeat abdominal ultrasound in 2021.  Abdominal  imaging: Abdominal ultrasound 03/07/2012: Normal no gallbladder polyps Abdominal ultrasound 2/12: 2 small gallbladder polyps Abdominal ultrasound 11/2013: 2 small gallbladder polyps Abdominal ultrasound 07/2015: 2 small gallbladder polyps Abdominal ultrasound 07/2016: 2 mm gallbladder polyp Abdominal ultrasound 08/02/2017: 3 mm gallbladder polyp and mild splenomegaly Abdominal ultrasound 05/02/19: 63mm gallbladder polyp, no acute abnormalities  -Colonoscopy (09/2019, Dr. Bryan Lemma): 6 mm sessile serrated polyp.  4 mm rectal nodule (biopsy: Benign mucosa), Internal hemorrhoids.  Normal TI  HPI:     Patient is a 49 y.o. male presenting to the Gastroenterology Clinic for follow-up to discuss results of recent EGD and colonoscopy, as outlined above.  Barium esophagram with thickened esophageal folds likely consistent with reflux, but otherwise normal.  No refluxate noted during the study, with plan for DM and pH/impedance if wanting to proceed with antireflux surgery.  Today, he states reflux still well controlled on current therapy, still very much interested in antireflux surgery as a means to stop or significantly reduce need for PPI therapy.  Still eating very healthy and maintaining active lifestyle.  GERD-HRQL Questionairre Score: 13/50 (on PPI)    Review of systems:     No chest pain, no SOB, no fevers, no urinary sx   Past Medical History:  Diagnosis Date  . Allergy    per pt  . Anxiety   . GERD (gastroesophageal reflux disease)    per pt    Patient's surgical history, family medical history, social history, medications and allergies were all reviewed in Epic    Current Outpatient Medications  Medication Sig Dispense Refill  . fluticasone (FLONASE) 50 MCG/ACT nasal spray Place 1 spray into both nostrils at bedtime.    . Lansoprazole (PREVACID 24HR PO) Take 1 tablet by mouth daily.    Marland Kitchen  vitamin B-12 (CYANOCOBALAMIN) 1000 MCG tablet Take 1,000 mcg by mouth daily.     No current  facility-administered medications for this visit.    Physical Exam:     BP 118/80   Pulse (!) 57   Temp (!) 96.9 F (36.1 C)   Ht 5' 8.5" (1.74 m)   Wt 186 lb 2 oz (84.4 kg)   BMI 27.89 kg/m   GENERAL:  Pleasant male in NAD PSYCH: : Cooperative, normal affect NEURO: Alert and oriented x 3, no focal neurologic deficits   IMPRESSION and PLAN:    1) GERD 2) Diaphragmatic defect -Recent EGD with normal Z-line, but Hill grade 3 valve with significant LES laxity, without significant axial height hiatal hernia.  Discussed these findings at length today.  High suspicion for anatomic dysfunction leading to reflux.  He is still very much interested in antireflux surgery as a means to stop acid suppression therapy and improved symptomatology.  -Esophageal manometry and pH/impedance testing off PPI therapy x7 days -Referral to Dr. Redmond Pulling at [facility] for consideration of diaphragmatic defect repair/crural repair and concomitant TIF (cTIF) -Follow-up after EM and pH/impedance complete -Long discussion regarding the risks, benefits of TIF, to include postoperative dietary/lifestyle/exercise limitations  3) Rectal nodule -Biopsies benign -Referral for rectal EUS  4) Sessile serrated polyp: -Repeat colonoscopy in 2026 for ongoing polyp surveillance  5) Internal hemorrhoids: -Asymptomatic -Resume current healthy eating with high-fiber diet  6) History of GB polyp: -Stable polyp on serial imaging studies.  Had previously planned for repeat ultrasound later this year.  Last ultrasound demonstrated 4 mm polyp.  Do not feel strongly that this needs to continue to be surveyed.      I spent 35 minutes of time, including in depth chart review, independent review of results as outlined above, communicating results with the patient directly, face-to-face time with the patient, coordinating care, and ordering studies and medications as appropriate, and documentation.       [place] ,DO,  FACG 11/20/2019, 10:08 AM

## 2019-11-21 ENCOUNTER — Telehealth: Payer: Self-pay

## 2019-11-21 ENCOUNTER — Other Ambulatory Visit: Payer: Self-pay

## 2019-11-21 DIAGNOSIS — K6289 Other specified diseases of anus and rectum: Secondary | ICD-10-CM

## 2019-11-21 NOTE — Telephone Encounter (Signed)
-----   Message from Irving Copas., MD sent at 11/21/2019  8:51 AM EDT ----- Regarding: RE: EUS Genae Strine, Non-urgent Rectal EUS with possible EMR with DJ or myself in the next few months to evaluate the rectal subepithelial lesion. Thanks. GM ----- Message ----- From: Timothy Lasso, RN Sent: 11/21/2019   8:13 AM EDT To: Milus Banister, MD, # Subject: FW: EUS                                        Thanks Claiborne Billings I will send this note to Dr Ardis Hughs and [place].  I will take care of it as soon as I get a response.   ----- Message ----- From: Angie Fava, LPN Sent: [id number]   7:55 AM EDT To: Timothy Lasso, RN Subject: EUS                                            Loui Massenburg Please refer to 11/20/19 office note regarding lower  EUS with either Dr Rush Landmark or Ardis Hughs.  Melany Guernsey

## 2019-11-21 NOTE — Telephone Encounter (Signed)
EUS scheduled, pt instructed and medications reviewed.  Patient instructions mailed to home.  Patient to call with any questions or concerns.  

## 2019-11-24 ENCOUNTER — Telehealth: Payer: Self-pay

## 2019-11-24 NOTE — Telephone Encounter (Signed)
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 Thanks. GM ----- Message ----- From: Timothy Lasso, RN Sent: 11/21/2019   8:13 AM EDT To: Milus Banister, MD, # Subject: FW: EUS                                        Thanks Claiborne Billings I will send this note to Dr Ardis Hughs and Mansouraty.  I will take care of it as soon as I get a response.   ----- Message ----- From: Angie Fava, LPN Sent: [id number]   7:55 AM EDT To: Timothy Lasso, RN Subject: EUS                                            Benjamin Ho Please refer to 11/20/19 office note regarding lower  EUS with either Dr Rush Landmark or Ardis Hughs.  Melany Guernsey

## 2019-11-24 NOTE — Telephone Encounter (Signed)
The pt has been scheduled for 6/14 and has been advised.

## 2019-12-06 ENCOUNTER — Other Ambulatory Visit (HOSPITAL_COMMUNITY): Payer: Managed Care, Other (non HMO)

## 2019-12-31 ENCOUNTER — Telehealth: Payer: Self-pay | Admitting: Gastroenterology

## 2020-01-01 NOTE — Telephone Encounter (Signed)
The pt called and states he would like to cancel the lower EUS and call back in a few weeks to reschedule.  The appt was cancelled.

## 2020-01-01 NOTE — Telephone Encounter (Signed)
Spoke to patient who had a few questions about rescheduling his EUS. I let him know that Chong Sicilian will be contacting him in June to schedule EUS for a date and time August with Dr Rush Landmark. All questions answered. Patient voiced understanding.

## 2020-01-02 NOTE — Telephone Encounter (Signed)
Thank you for update. Will most likely be in August at this time, but certainly OK based on the recent colonoscopy for that time period. Thanks. GM

## 2020-01-08 ENCOUNTER — Other Ambulatory Visit (HOSPITAL_COMMUNITY): Payer: Managed Care, Other (non HMO)

## 2020-01-12 ENCOUNTER — Encounter (HOSPITAL_COMMUNITY): Admission: RE | Payer: Self-pay | Source: Home / Self Care

## 2020-01-12 ENCOUNTER — Ambulatory Visit (HOSPITAL_COMMUNITY)
Admission: RE | Admit: 2020-01-12 | Payer: Managed Care, Other (non HMO) | Source: Home / Self Care | Admitting: Gastroenterology

## 2020-01-12 SURGERY — ULTRASOUND, LOWER GI TRACT, ENDOSCOPIC
Anesthesia: Monitor Anesthesia Care

## 2020-01-29 ENCOUNTER — Telehealth: Payer: Self-pay | Admitting: Gastroenterology

## 2020-01-29 NOTE — Telephone Encounter (Signed)
The pt will call back around the end of July to get procedure rescheduled with Dr Rush Landmark

## 2020-01-29 NOTE — Telephone Encounter (Signed)
Pt would like to r/s procedure with Dr. Rush Landmark, pls call him.

## 2020-02-13 ENCOUNTER — Telehealth: Payer: Self-pay

## 2020-02-13 NOTE — Telephone Encounter (Signed)
-----   Message from Angie Fava, LPN sent at 2/90/3795  7:57 AM EDT ----- Regarding: ESO mano ESO mano in August refer to 11/20/19 office note

## 2020-02-13 NOTE — Telephone Encounter (Signed)
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

## 2020-02-27 NOTE — Telephone Encounter (Signed)
Left message on machine to call back  

## 2020-02-27 NOTE — Telephone Encounter (Signed)
Pt is requesting a call back from Patty to reschedule his procedure

## 2020-03-01 ENCOUNTER — Other Ambulatory Visit: Payer: Self-pay

## 2020-03-01 DIAGNOSIS — K6289 Other specified diseases of anus and rectum: Secondary | ICD-10-CM

## 2020-03-01 DIAGNOSIS — K629 Disease of anus and rectum, unspecified: Secondary | ICD-10-CM

## 2020-03-01 NOTE — Telephone Encounter (Signed)
Left message on machine to call back  

## 2020-04-22 ENCOUNTER — Other Ambulatory Visit (HOSPITAL_COMMUNITY)
Admission: RE | Admit: 2020-04-22 | Discharge: 2020-04-22 | Disposition: A | Discharge status: Home / Self Care | Payer: Managed Care, Other (non HMO) | Source: Ambulatory Visit | Attending: Gastroenterology | Admitting: Gastroenterology

## 2020-04-22 DIAGNOSIS — Z01812 Encounter for preprocedural laboratory examination: Secondary | ICD-10-CM | POA: Insufficient documentation

## 2020-04-22 DIAGNOSIS — Z20822 Contact with and (suspected) exposure to covid-19: Secondary | ICD-10-CM | POA: Insufficient documentation

## 2020-04-22 LAB — SARS CORONAVIRUS 2 (TAT 6-24 HRS): SARS Coronavirus 2: NEGATIVE

## 2020-04-23 ENCOUNTER — Other Ambulatory Visit: Payer: Self-pay

## 2020-04-23 ENCOUNTER — Encounter (HOSPITAL_COMMUNITY): Payer: Self-pay | Admitting: Gastroenterology

## 2020-04-23 NOTE — Progress Notes (Signed)
Spoke with pt for pre-op call. Pt denies cardiac history, HTN or Diabetes.  Covid test done 04/22/20 and it's negative. Pt states he's been in quarantine since the test was done and understands to stay in quarantine until he comes to the hospital on Monday.

## 2020-04-26 ENCOUNTER — Ambulatory Visit (HOSPITAL_COMMUNITY)
Admission: RE | Admit: 2020-04-26 | Discharge: 2020-04-26 | Disposition: A | Discharge status: Home / Self Care | Payer: Managed Care, Other (non HMO) | Attending: Gastroenterology | Admitting: Gastroenterology

## 2020-04-26 ENCOUNTER — Other Ambulatory Visit: Payer: Self-pay

## 2020-04-26 ENCOUNTER — Ambulatory Visit (HOSPITAL_COMMUNITY): Payer: Managed Care, Other (non HMO) | Admitting: Certified Registered Nurse Anesthetist

## 2020-04-26 ENCOUNTER — Encounter (HOSPITAL_COMMUNITY)
Admission: RE | Disposition: A | Discharge status: Home / Self Care | Payer: Self-pay | Source: Home / Self Care | Attending: Gastroenterology

## 2020-04-26 ENCOUNTER — Encounter (HOSPITAL_COMMUNITY): Payer: Self-pay | Admitting: Gastroenterology

## 2020-04-26 DIAGNOSIS — I899 Noninfective disorder of lymphatic vessels and lymph nodes, unspecified: Secondary | ICD-10-CM

## 2020-04-26 DIAGNOSIS — K641 Second degree hemorrhoids: Secondary | ICD-10-CM | POA: Diagnosis not present

## 2020-04-26 DIAGNOSIS — K629 Disease of anus and rectum, unspecified: Secondary | ICD-10-CM

## 2020-04-26 DIAGNOSIS — K644 Residual hemorrhoidal skin tags: Secondary | ICD-10-CM | POA: Diagnosis not present

## 2020-04-26 DIAGNOSIS — K6289 Other specified diseases of anus and rectum: Secondary | ICD-10-CM

## 2020-04-26 DIAGNOSIS — Q439 Congenital malformation of intestine, unspecified: Secondary | ICD-10-CM | POA: Diagnosis present

## 2020-04-26 HISTORY — PX: FLEXIBLE SIGMOIDOSCOPY: SHX5431

## 2020-04-26 HISTORY — PX: EUS: SHX5427

## 2020-04-26 SURGERY — SIGMOIDOSCOPY, FLEXIBLE
Anesthesia: Monitor Anesthesia Care

## 2020-04-26 MED ORDER — MEPERIDINE HCL 100 MG/ML IJ SOLN: 6.2500 mg | INTRAMUSCULAR | Status: DC | PRN

## 2020-04-26 MED ORDER — ONDANSETRON HCL 4 MG/2ML IJ SOLN: 4.0000 mg | Freq: Once | INTRAMUSCULAR | Status: DC | PRN

## 2020-04-26 MED ORDER — PROPOFOL 10 MG/ML IV BOLUS
INTRAVENOUS | Status: DC | PRN
  Administered 2020-04-26: 20 mg via INTRAVENOUS
  Administered 2020-04-26: 30 mg via INTRAVENOUS
  Administered 2020-04-26: 25 mg via INTRAVENOUS
  Administered 2020-04-26: 100 ug/kg/min via INTRAVENOUS

## 2020-04-26 MED ORDER — LACTATED RINGERS IV SOLN: INTRAVENOUS | Status: DC

## 2020-04-26 MED ORDER — SODIUM CHLORIDE 0.9 % IV SOLN: INTRAVENOUS | Status: DC

## 2020-04-26 MED ORDER — HYDROMORPHONE HCL 1 MG/ML IJ SOLN: 0.2500 mg | INTRAMUSCULAR | Status: DC | PRN

## 2020-04-26 MED ORDER — PHENYLEPHRINE HCL (PRESSORS) 10 MG/ML IV SOLN
INTRAVENOUS | Status: DC | PRN
  Administered 2020-04-26 (×2): 80 ug via INTRAVENOUS

## 2020-04-26 MED ORDER — LIDOCAINE HCL (CARDIAC) PF 100 MG/5ML IV SOSY
PREFILLED_SYRINGE | INTRAVENOUS | Status: DC | PRN
  Administered 2020-04-26: 100 mg via INTRAVENOUS

## 2020-04-26 NOTE — H&P (Signed)
GASTROENTEROLOGY PROCEDURE H&P NOTE   Primary Care Physician: Sueanne Margarita, DO  HPI: Benjamin Ho is a 49 y.o. male who presents for Colonoscopy/Flexible Sigmoidoscopy with EUS and possible EMR or rectal nodule.  Past Medical History:  Diagnosis Date  . Allergy    per pt  . Anxiety   . GERD (gastroesophageal reflux disease)    per pt   Past Surgical History:  Procedure Laterality Date  . FINGER SURGERY    . mallet finger     pinky right hand  . UPPER GASTROINTESTINAL ENDOSCOPY     8-10 yrs ago per pt.   Current Facility-Administered Medications  Medication Dose Route Frequency Provider Last Rate Last Admin  . 0.9 %  sodium chloride infusion   Intravenous Continuous Mansouraty, Telford Nab., MD      . lactated ringers infusion   Intravenous Continuous Mansouraty, Telford Nab., MD 10 mL/hr at 04/26/20 0718 New Bag at 04/26/20 0762   Allergies  Allergen Reactions  . Cefadroxil Swelling and Rash   Family History  Problem Relation Age of Onset  . Diabetes Mother   . Hypertension Father   . Thyroid disease Sister   . Lung cancer Maternal Grandfather        smoker  . Cancer Paternal Grandfather        stomach or intestinal  . Cancer Cousin        colon vs stomach per pt  . Esophageal cancer Neg Hx   . Colon cancer Neg Hx   . Colon polyps Neg Hx   . Rectal cancer Neg Hx   . Stomach cancer Neg Hx    Social History   Socioeconomic History  . Marital status: Significant Other    Spouse name: Not on file  . Number of children: 0  . Years of education: Not on file  . Highest education level: Not on file  Occupational History  . Occupation: Finance  Tobacco Use  . Smoking status: Never Smoker  . Smokeless tobacco: Never Used  Vaping Use  . Vaping Use: Never used  Substance and Sexual Activity  . Alcohol use: Never  . Drug use: Never  . Sexual activity: Not on file  Other Topics Concern  . Not on file  Social History Narrative  . Not on  file   Social Determinants of Health   Financial Resource Strain:   . Difficulty of Paying Living Expenses: Not on file  Food Insecurity:   . Worried About Charity fundraiser in the Last Year: Not on file  . Ran Out of Food in the Last Year: Not on file  Transportation Needs:   . Lack of Transportation (Medical): Not on file  . Lack of Transportation (Non-Medical): Not on file  Physical Activity:   . Days of Exercise per Week: Not on file  . Minutes of Exercise per Session: Not on file  Stress:   . Feeling of Stress : Not on file  Social Connections:   . Frequency of Communication with Friends and Family: Not on file  . Frequency of Social Gatherings with Friends and Family: Not on file  . Attends Religious Services: Not on file  . Active Member of Clubs or Organizations: Not on file  . Attends Archivist Meetings: Not on file  . Marital Status: Not on file  Intimate Partner Violence:   . Fear of Current or Ex-Partner: Not on file  . Emotionally Abused: Not on file  .  Physically Abused: Not on file  . Sexually Abused: Not on file    Physical Exam: Vital signs in last 24 hours: Temp:  [98.9 F (37.2 C)] 98.9 F (37.2 C) (09/27 0648) Pulse Rate:  [84] 84 (09/27 0648) Resp:  [15] 15 (09/27 0648) BP: (118)/(80) 118/80 (09/27 0648) SpO2:  [95 %] 95 % (09/27 0648) Weight:  [81.6 kg] 81.6 kg (09/27 0648)   GEN: NAD EYE: Sclerae anicteric ENT: MMM CV: Non-tachycardic GI: Soft, NT/ND NEURO:  Alert & Oriented x 3  Lab Results: No results for input(s): WBC, HGB, HCT, PLT in the last 72 hours. BMET No results for input(s): NA, K, CL, CO2, GLUCOSE, BUN, CREATININE, CALCIUM in the last 72 hours. LFT No results for input(s): PROT, ALBUMIN, AST, ALT, ALKPHOS, BILITOT, BILIDIR, IBILI in the last 72 hours. PT/INR No results for input(s): LABPROT, INR in the last 72 hours.   Impression / Plan: This is a 49 y.o.male who presents for Colonoscopy/Flexible  Sigmoidoscopy with EUS and possible EMR or rectal nodule.  The risks of an EUS including intestinal perforation, bleeding, infection, aspiration, and medication effects were discussed as was the possibility it may not give a definitive diagnosis if a biopsy is performed.    The risks and benefits of endoscopic evaluation were discussed with the patient; these include but are not limited to the risk of perforation, infection, bleeding, missed lesions, lack of diagnosis, severe illness requiring hospitalization, as well as anesthesia and sedation related illnesses.  The patient is agreeable to proceed.    Justice Britain, MD [facility] Office # [phone]

## 2020-04-26 NOTE — Anesthesia Postprocedure Evaluation (Signed)
Anesthesia Post Note  Patient: [place]  Procedure(s) Performed: FLEXIBLE SIGMOIDOSCOPY (N/A ) LOWER ENDOSCOPIC ULTRASOUND (EUS) (N/A )     Patient location during evaluation: PACU Anesthesia Type: MAC Level of consciousness: awake and alert Pain management: pain level controlled Vital Signs Assessment: post-procedure vital signs reviewed and stable Respiratory status: spontaneous breathing, nonlabored ventilation, respiratory function stable and patient connected to nasal cannula oxygen Cardiovascular status: blood pressure returned to baseline and stable Postop Assessment: no apparent nausea or vomiting Anesthetic complications: no   No complications documented.  Last Vitals:  Vitals:   04/26/20 0910 04/26/20 0915  BP: 118/84 111/87  Pulse: (!) 57 (!) 53  Resp: 14 17  Temp: 36.5 C   SpO2: 99% 98%    Last Pain:  Vitals:   04/26/20 0910  TempSrc:   PainSc: 0-No pain                 Lyvia Mondesir DAVID

## 2020-04-26 NOTE — Transfer of Care (Signed)
Immediate Anesthesia Transfer of Care Note  Patient: Benjamin Ho  Procedure(s) Performed: FLEXIBLE SIGMOIDOSCOPY (N/A ) LOWER ENDOSCOPIC ULTRASOUND (EUS) (N/A )  Patient Location: PACU  Anesthesia Type:MAC  Level of Consciousness: drowsy  Airway & Oxygen Therapy: Patient Spontanous Breathing and Patient connected to nasal cannula oxygen  Post-op Assessment: Report given to RN, Post -op Vital signs reviewed and stable and Patient moving all extremities  Post vital signs: Reviewed and stable  Last Vitals:  Vitals Value Taken Time  BP 111/79 04/26/20 0842  Temp    Pulse 58 04/26/20 0843  Resp 19 04/26/20 0843  SpO2 99 % 04/26/20 0843  Vitals shown include unvalidated device data.  Last Pain:  Vitals:   04/26/20 0648  TempSrc: Oral  PainSc: 0-No pain         Complications: No complications documented.

## 2020-04-26 NOTE — Anesthesia Preprocedure Evaluation (Signed)
Anesthesia Evaluation  Patient identified by MRN, date of birth, ID band Patient awake    Reviewed: Allergy & Precautions, NPO status , Patient's Chart, lab work & pertinent test results  Airway Mallampati: I  TM Distance: >3 FB Neck ROM: Full    Dental   Pulmonary    Pulmonary exam normal        Cardiovascular Normal cardiovascular exam     Neuro/Psych Anxiety    GI/Hepatic GERD  Medicated and Controlled,  Endo/Other    Renal/GU      Musculoskeletal   Abdominal   Peds  Hematology   Anesthesia Other Findings   Reproductive/Obstetrics                             Anesthesia Physical Anesthesia Plan  ASA: II  Anesthesia Plan: MAC   Post-op Pain Management:    Induction: Intravenous  PONV Risk Score and Plan: 1 and Treatment may vary due to age or medical condition  Airway Management Planned: Nasal Cannula  Additional Equipment:   Intra-op Plan:   Post-operative Plan:   Informed Consent: I have reviewed the patients History and Physical, chart, labs and discussed the procedure including the risks, benefits and alternatives for the proposed anesthesia with the patient or authorized representative who has indicated his/her understanding and acceptance.       Plan Discussed with: CRNA and Surgeon  Anesthesia Plan Comments:         Anesthesia Quick Evaluation

## 2020-04-26 NOTE — Op Note (Signed)
[facility] Patient Name: Benjamin Ho Procedure Date : 04/26/2020 MRN: [id number] Attending MD: Justice Britain , MD Date of Birth: 11-13-1970 CSN: [id number] Age: 49 Admit Type: Outpatient Procedure:                Lower EUS Indications:              Rectal deformity found on endoscopy; subepithelial                            tumor versus extrinsic compression Providers:                Justice Britain, MD, Jeanella Cara, RN,                            Cletis Athens, Technician, Raphael Gibney, CRNA Referring MD:             Gerrit Heck, MD, Sueanne Margarita Medicines:                Monitored Anesthesia Care Complications:            No immediate complications. Estimated Blood Loss:     Estimated blood loss: none. Procedure:                Pre-Anesthesia Assessment:                           - Prior to the procedure, a History and Physical                            was performed, and patient medications and                            allergies were reviewed. The patient's tolerance of                            previous anesthesia was also reviewed. The risks                            and benefits of the procedure and the sedation                            options and risks were discussed with the patient.                            All questions were answered, and informed consent                            was obtained. Prior Anticoagulants: The patient has                            taken no previous anticoagulant or antiplatelet                            agents. ASA Grade Assessment: II - A patient with  mild systemic disease. After reviewing the risks                            and benefits, the patient was deemed in                            satisfactory condition to undergo the procedure.                           After obtaining informed consent, the endoscope was                            passed under  direct vision. Throughout the                            procedure, the patient's blood pressure, pulse, and                            oxygen saturations were monitored continuously. The                            [id number] ([id number]) Olympus therapeutic                            gastroscope was introduced through the anus with                            plan to complete evaluation in the left colon,                            however, advancement was easy and the scope was                            advanced to the the cecum, identified by                            appendiceal orifice and ileocecal valve. The                            [id number] ([id number]) Olympus Radial EUS scope was                            introduced through the anus and advanced to the the                            sigmoid colon for ultrasound. The lower EUS was                            accomplished without difficulty. The patient                            tolerated the procedure. The quality of the bowel  preparation was fair. Scope In: 7:58:41 AM Scope Out: 8:35:00 AM Scope Withdrawal Time: 0 hours 31 minutes 33 seconds  Total Procedure Duration: 0 hours 36 minutes 19 seconds  Findings:      ENDOSCOPIC FINDING: :      The digital rectal exam findings include hemorrhoids. Pertinent       negatives include no palpable rectal lesions.      A large amount of semi-solid stool was found in the ascending colon and       in the cecum, interfering with visualization.      Normal mucosa was found in the entire colon otherwise.      No evidence of a rectal nodularity was present.      Non-bleeding non-thrombosed external and internal hemorrhoids were found       during retroflexion, during perianal exam and during digital exam. The       hemorrhoids were Grade II (internal hemorrhoids that prolapse but reduce       spontaneously).      ENDOSONOGRAPHIC FINDING: :      The rectosigmoid  junction and sigmoid colon walls were normal.      The rectum was normal.      The perirectal space was normal.      No malignant-appearing lymph nodes were visualized in the perirectal       region and in the left iliac region. The nodes were.      The internal anal sphincter was visualized endosonographically and       appeared normal. Impression:               Endoscopic Impression:                           - Hemorrhoids found on digital rectal exam.                           - Preparation of the colon was fair.                           - Stool in the ascending colon and in the cecum.                           - Normal mucosa in the entire examined colon                            otherwise.                           - No evidence of significant rectal nodularity                            present on today's examination.                           - Non-bleeding non-thrombosed external and internal                            hemorrhoids.                           EUS Impression:                           -  Endosonographic imaging showed no sign of                            significant pathology at the rectosigmoid junction                            and in the sigmoid colon.                           - Endosonographic images of the rectum were                            unremarkable.                           - Endosonographic images of the perirectal space                            were unremarkable.                           - No malignant-appearing lymph nodes were                            visualized endosonographically in the perirectal                            region and in the left iliac region.                           - The internal anal sphincter was visualized                            endosonographically and appeared normal. Recommendation:           - The patient will be observed post-procedure,                            until all discharge criteria are met.                            - Discharge patient to home.                           - Patient has a contact number available for                            emergencies. The signs and symptoms of potential                            delayed complications were discussed with the                            patient. Return to normal activities tomorrow.  Written discharge instructions were provided to the                            patient.                           - Recommendation for 1-2 year followup Flexible                            sigmoidoscopy +/- with EUS to ensure no nodularity                            has recurred. If nothing is found then normal                            endoscopic follow up for colonoscopy at the 5-year                            indicated interval for history of SSP would be                            recommended (Dr. Bryan Lemma and I will discuss                            this).                           - The findings and recommendations were discussed                            with the patient.                           - The findings and recommendations were discussed                            with the designated responsible adult. Procedure Code(s):        --- Professional ---                           [phone], Colonoscopy, flexible; with endoscopic                            ultrasound examination limited to the rectum,                            sigmoid, descending, transverse, or ascending colon                            and cecum, and adjacent structures Diagnosis Code(s):        --- Professional ---                           K64.1, Second degree hemorrhoids  I89.9, Noninfective disorder of lymphatic vessels                            and lymph nodes, unspecified                           K62.89, Other specified diseases of anus and rectum CPT copyright 2019 [employer]. All rights  reserved. The codes documented in this report are preliminary and upon coder review may  be revised to meet current compliance requirements. Justice Britain, MD 04/26/2020 8:56:27 AM Number of Addenda: 0

## 2020-04-27 ENCOUNTER — Encounter (HOSPITAL_COMMUNITY): Payer: Self-pay | Admitting: Gastroenterology

## 2020-07-29 IMAGING — RF DG ESOPHAGUS
8 of 13 series · 12 of 24 positions shown · non-contrast
Comparison: None.

CLINICAL DATA: 48-year-old male with history of gastroesophageal
reflux.

EXAM:
ESOPHOGRAM / BARIUM SWALLOW / BARIUM TABLET STUDY
TECHNIQUE: Combined double contrast and single contrast examination performed
using effervescent crystals, thick barium liquid, and thin barium
liquid. The patient was observed with fluoroscopy swallowing a 13 mm
barium sulphate tablet.
FLUOROSCOPY TIME:  Fluoroscopy Time:  1 minutes and 42 seconds
Radiation Exposure Index (if provided by the fluoroscopic device):
13.4 mGy
Number of Acquired Spot Images: 0

[Series 2: fluoro_barium 2fps_bw · 0.17mm/px · 1 of 1 slices shown (1 of 3)]
[im 1/1]
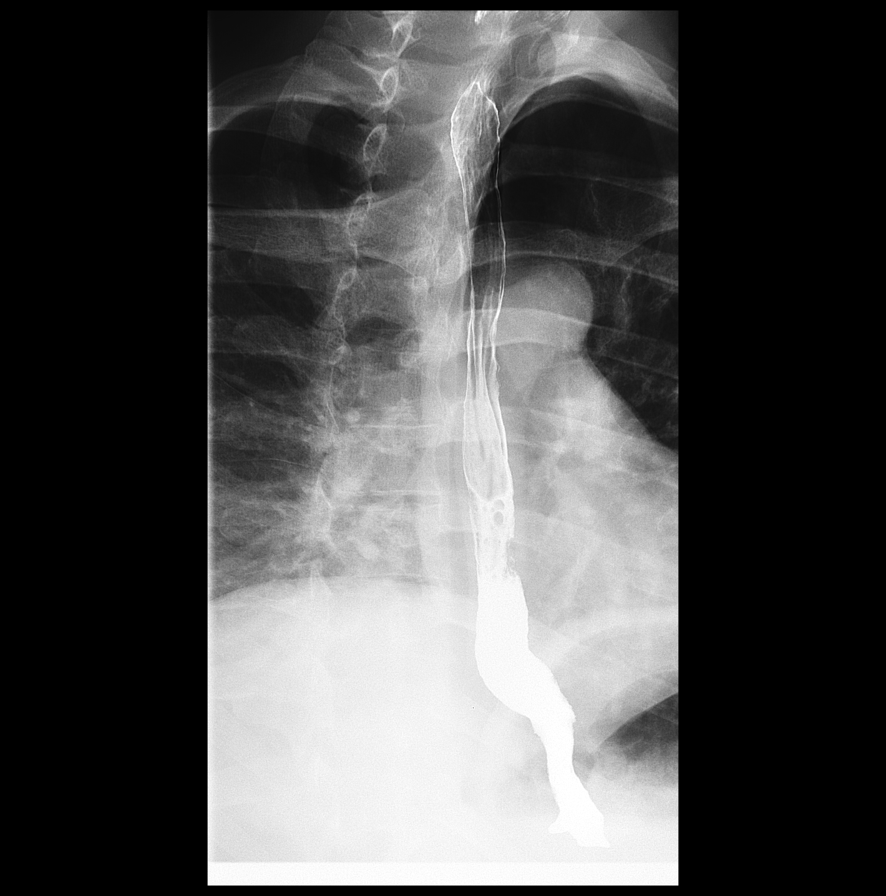

[Series 4: fluoro_barium 2fps_bw · 0.17mm/px · 1 of 1 slices shown (2 of 3)]
[im 1/1]
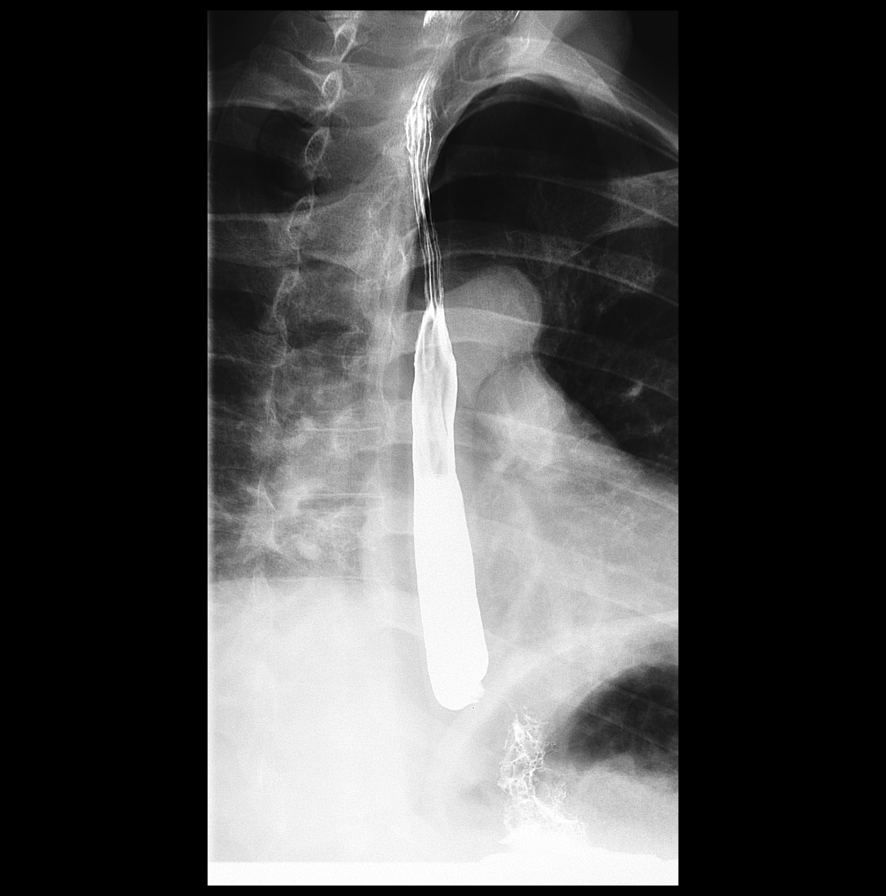

[Series 6: cp_standard · 0.51mm/px · 2 of 24 frames shown (1 of 5)]
[frame 1/24]
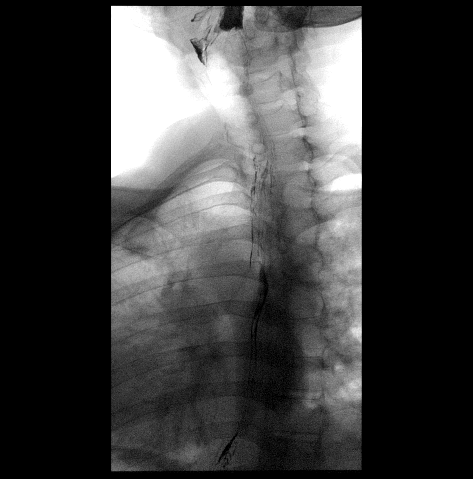
[frame 13/24]
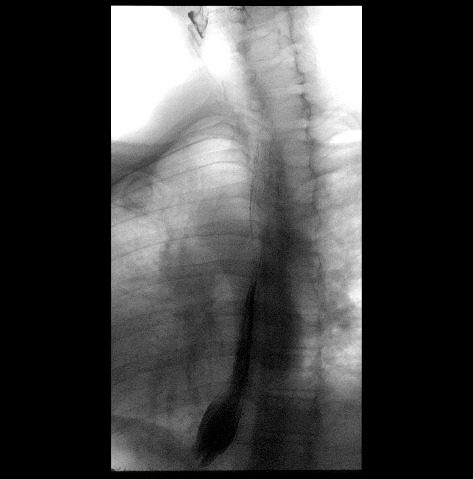

[Series 7: cp_standard · 0.51mm/px · 2 of 28 frames shown (2 of 5)]
[frame 1/28]
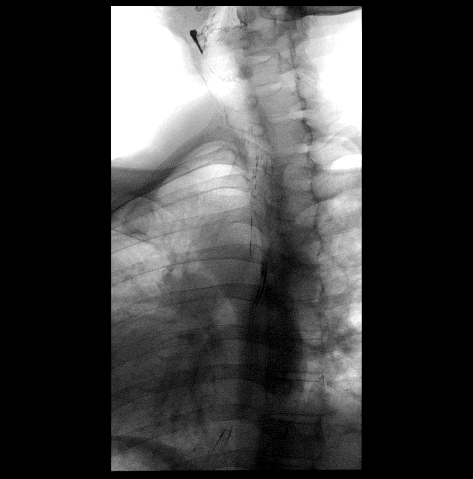
[frame 15/28]
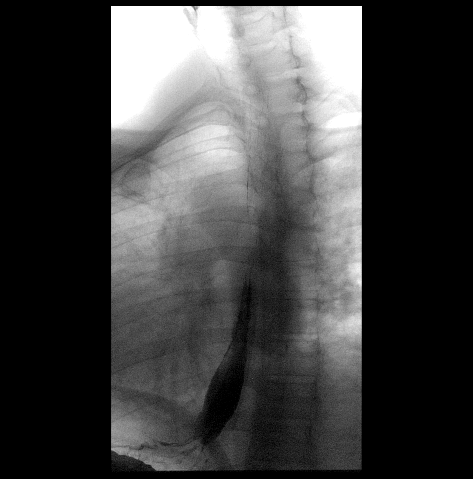

[Series 8: cp_standard · 0.51mm/px · 2 of 29 frames shown (3 of 5)]
[frame 15/29]
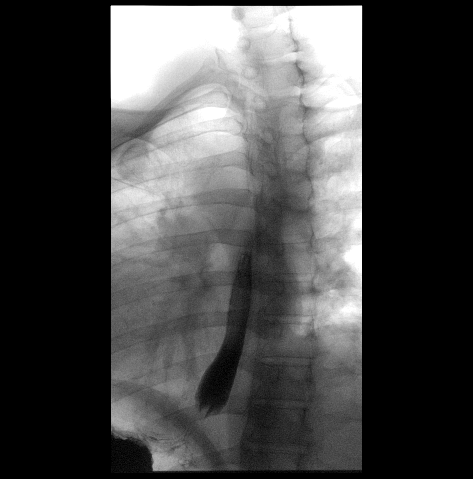
[frame 29/29]
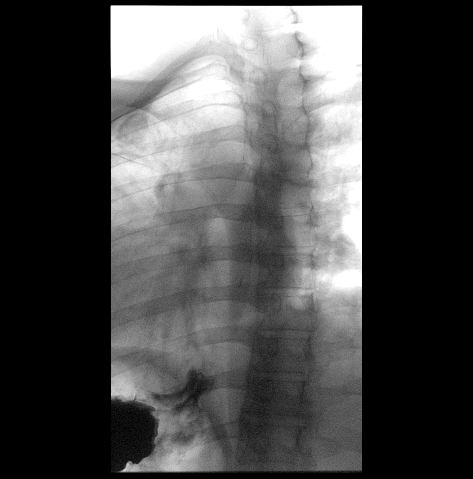

[Series 10: fluoro_barium 2fps_bw · 0.17mm/px · 1 of 1 slices shown (3 of 3)]
[im 1/1]
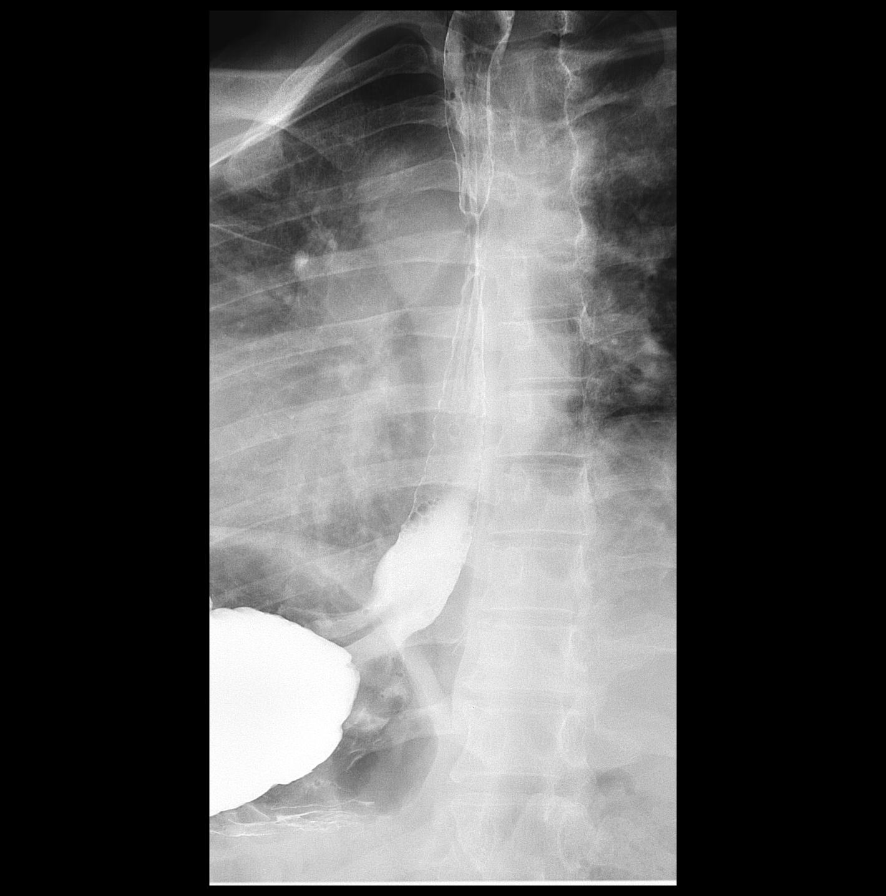

[Series 11: cp_standard · 0.51mm/px · 2 of 64 frames shown (4 of 5)]
[frame 33/64]
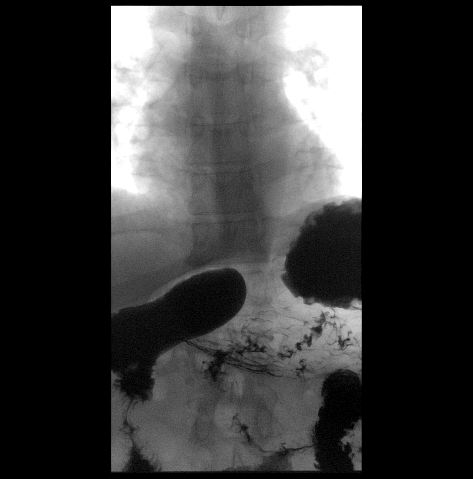
[frame 56/64]
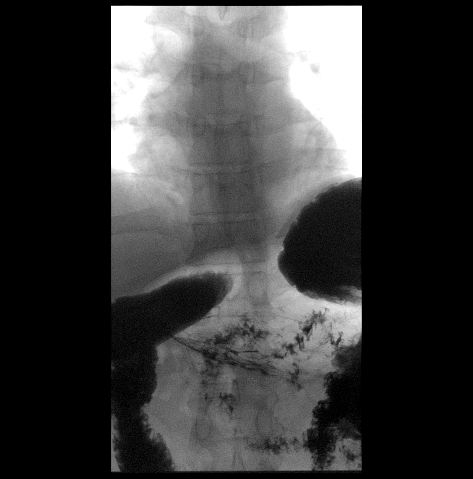

[Series 13: cp_standard · 0.25mm/px · 1 of 1 slices shown (5 of 5)]
[im 1/1]
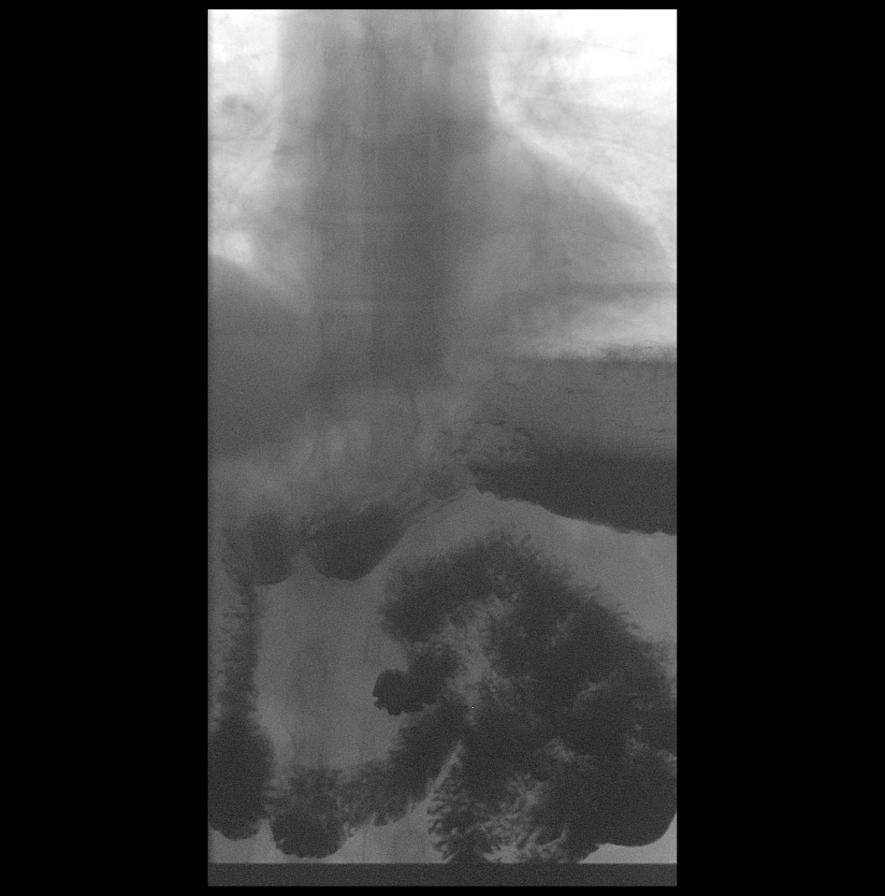

[12 of 24 positions shown; findings below may reference images not displayed]

FINDINGS: Double contrast images demonstrated some thickening of the
esophageal folds. Multiple single swallow attempts were observed,
which demonstrated generally normal esophageal motility, although
some occasional tertiary contractions were observed at various
points in the examination. Full column esophagram demonstrated no
esophageal mass, stricture or esophageal ring. No hiatal hernia.
Water siphon test demonstrated no gastroesophageal reflux. A barium
tablet was administered, which passed readily into the stomach.
IMPRESSION: 1. Thickening of esophageal folds, likely sequela of recurrent
gastroesophageal reflux. Esophagus is otherwise structurally normal.
2. No gastroesophageal reflux observed during water siphon test.
3. Occasional tertiary contractions.

## 2020-07-31 HISTORY — PX: WISDOM TOOTH EXTRACTION: SHX21

## 2021-09-06 ENCOUNTER — Encounter: Payer: Managed Care, Other (non HMO) | Attending: Internal Medicine | Admitting: Dietician

## 2021-09-06 ENCOUNTER — Other Ambulatory Visit: Payer: Self-pay

## 2021-09-06 ENCOUNTER — Encounter: Payer: Self-pay | Admitting: Dietician

## 2021-09-06 VITALS — Ht 68.0 in | Wt 185.1 lb

## 2021-09-06 DIAGNOSIS — Z713 Dietary counseling and surveillance: Secondary | ICD-10-CM | POA: Diagnosis not present

## 2021-09-06 DIAGNOSIS — E781 Pure hyperglyceridemia: Secondary | ICD-10-CM | POA: Diagnosis not present

## 2021-09-06 NOTE — Progress Notes (Signed)
Medical Nutrition Therapy  Appointment Start time:  [id number]  Appointment End time:  19  Primary concerns today: Reflux,   Referral diagnosis: E78.1- Hypertriglyceridemia Preferred learning style: No preference indicated Learning readiness: Contemplating   NUTRITION ASSESSMENT   Anthropometrics  Ht: 5'8" Wt: 185.1 lbs  Body mass index is 28.14 kg/m.   Clinical Medical Hx: HLD, GERD, H. Pylori Medications: Prevacid, Flonase Labs: TGL - 166 (11/21), HDL - 33 Notable Signs/Symptoms: N/A  Lifestyle & Dietary Hx Pt reports frequent reflux, would like to reduce symptoms and eventually stop taking Prevacid. Pt reports a family history of T2DM and is concerned that their blood sugar will continue to rise.  Pt states they have a sweet tooth, wants to eat something sweet every day. Pt will eat M&M's, cakes, cookies, and dried fruit (mango, prunes) to satisfy their sweet tooth. Pt states they get stressed and bored with work which leads them to eat sweets. Pt states this is when they will over consume sweets. Pt reports that they will never overeat until they are uncomfortable. Pt states their job is very deadline driven and rigid, which makes it high stress. Pt states they can recognize patterns that lead up to high stress. Pt reports working with a therapist a year ago, but therapist moved and they have not pursued another. Pt states they operate best in black and white, all or nothing.  Pt is very active, pt likes running, pickleball, HIIT and core routines. Pt had a leg injury that was found to be connected to lower back tightness, pt is doing stretches that helped a lot and reduced the pain, and is able to exercise like normal now. Pt exercises in the morning before breakfast. Pt likes to drink water, coconut water, vitamin water. Can not drink carbonated beverages. Occasionally makes smoothies with fruit, honey, coconut water. Breakfast 7:30 - 8:00 am Lunch 12:00 - 12:30 pm Dinner -  7:00 pm   Estimated daily fluid intake: 100 oz Supplements: Airborne Elderberry Sleep: disturbed by reflux Stress / self-care: Very high Current average weekly physical activity: 6 days, 30-90 minutes.  24-Hr Dietary Recall First Meal:  Snack:  Second Meal:  Snack:  Third Meal:  Snack:  Beverages:     NUTRITION DIAGNOSIS  NB-1.7 Undesireable food choices As related to high stress.  As evidenced by consistently high stress level at work, and stress eating of high sugar items.   NUTRITION INTERVENTION  Nutrition education (E-1) on the following topics:  Counseled patient on allowing themselves to be present in their emotions when they consider emotional eating. Advised patient to evaluate whether the impulse to eat is hunger based, or emotionally driven. Educated patient on the physiological effects of stress on their body. Educated pt on potential food sources (high fat foods,spicy foods, high refined sugar foods, caffeine, etc.) that can trigger reflux and/or other GERD symptoms including chest pain. Educated pt to avoid eating within 2-3 hours of going to bed to minimize reflux. Advised pt to sleep with head elevated to decrease reflux. Educated pt on the potential health complications that can occur related to uncontrolled GERD, including stomach ulcers and esophageal erosion/cancer. Educated pt on the potential for B12 deficiency related to acid reducing medication.   Handouts Provided Include  GERD Nutrition Therapy Nutrition Care Manual  Learning Style & Readiness for Change Teaching method utilized: Visual & Auditory  Demonstrated degree of understanding via: Teach Back  Barriers to learning/adherence to lifestyle change: All or nothing outlook  Goals Established  by Pt Find whole fruits to snack on in place of dried fruits. If you find yourself getting very stressed or bored and going to eat some sweets, take 10 breaths between going to the pantry and eating the sweets.  This can help to begin to put some space between the impulsive response to stress. Pay attention to the patterns that lead up to periods of high stress and try to change the trajectory of your day earlier on before you get overwhelmed. Look at the nutrition facts label of your packaged foods and try to make sure it has no more than 10% daily value of fat. Use a combination of these three strategies to effectively lower your consumption of undesirable food choices (high fat foods) 1) Consume a smaller portion when having that food 2) Consume that food  less frequently 3) Find a reduced or low fat version   MONITORING & EVALUATION Dietary intake, weekly physical activity, and stress level in 2 months.  Next Steps  Patient is to focus on stress releif, follow up with RD.

## 2021-09-06 NOTE — Patient Instructions (Addendum)
Find whole fruits to snack on in place of dried fruits.  If you find yourself getting very stressed or bored and going to eat some sweets, take 10 breaths between going to the pantry and eating the sweets. This can help to begin to put some space between the impulsive response to stress.  Pay attention to the patterns that lead up to periods of high stress and try to change the trajectory of your day earlier on before you get overwhelmed.  Look at the nutrition facts label of your packaged foods and try to make sure it has no more than 10% daily value of fat.  Use a combination of these three strategies to effectively lower your consumption of undesirable food choices (high fat foods) 1) Consume a smaller portion when having that food 2) Consume that food  less frequently 3) Find a reduced or low fat version

## 2021-11-08 ENCOUNTER — Encounter: Payer: Self-pay | Admitting: Dietician

## 2021-11-08 ENCOUNTER — Encounter: Payer: Managed Care, Other (non HMO) | Attending: Internal Medicine | Admitting: Dietician

## 2021-11-08 VITALS — Ht 68.0 in | Wt 184.3 lb

## 2021-11-08 DIAGNOSIS — E781 Pure hyperglyceridemia: Secondary | ICD-10-CM | POA: Diagnosis present

## 2021-11-08 NOTE — Progress Notes (Signed)
Medical Nutrition Therapy  ?Appointment Start time:  [id number]  Appointment End time:  0930 ? ?Primary concerns today: Reflux   ?Referral diagnosis: E78.1- Hypertriglyceridemia ?Preferred learning style: No preference indicated ?Learning readiness: Contemplating ? ? ?NUTRITION ASSESSMENT  ? ?Anthropometrics  ?Ht: 5'8" ?Wt: 184.2 lbs  ?Body mass index is 28.02 kg/m?. ? ? ?Clinical ?Medical Hx: HLD, GERD, H. Pylori ?Medications: Prevacid, Flonase ?Labs: TGL - 166 (11/21), HDL - 33 ?Notable Signs/Symptoms: N/A ? ?Lifestyle & Dietary Hx ?Pt states that they are eating less dried fruit, only eating a couple prunes in their cereal in the morning and eats dried mangoes twice a week. Pt has been eating more whole fruit in place of dried fruit as well (melons, blueberries). ?Pt states that they still struggle with sweets, wanting to find alternative options. ?Pt reports going to [employer] and getting meal kits of a protein, starch, and vegetables. ?Pt states that they are hungry in between meals during. Pt will snack and not realize how much they eat and feel unsatisfied because they don't recognize that they were eating. ?Pt reports lower back pain is less now, pt continues to be active and exercise in the morning. ?Pt states the stress of work is their biggest contributor to reflux. Pt has noticed that they can eat certain foods that only cause reflux when that are stressed, mainly chocolate. Pt states that they tend to eat foods that cause reflux much more when stressed. ? ? ?Estimated daily fluid intake: 100 oz ?Supplements: Airborne Elderberry ?Sleep: disturbed by reflux ?Stress / self-care: Very high ?Current average weekly physical activity: 6 days, 30-90 minutes. ? ? ?24-Hr Dietary Recall ?First Meal:  ?Snack:  ?Second Meal:  ?Snack:  ?Third Meal:  ?Snack:  ?Beverages:  ? ? ? ?NUTRITION DIAGNOSIS  ?NB-1.7 Undesireable food choices As related to high stress.  As evidenced by consistently high stress level at work, and  stress eating of high sugar items. ? ? ?NUTRITION INTERVENTION  ?Nutrition education (E-1) on the following topics:  ?Counseled patient on allowing themselves to be present in their emotions when they consider emotional eating. Advised patient to evaluate whether the impulse to eat is hunger based, or emotionally driven. Educated patient on the physiological effects of stress on their body. ?Educated pt on potential food sources (high fat foods,spicy foods, high refined sugar foods, caffeine, etc.) that can trigger reflux and/or other GERD symptoms including chest pain. Educated pt to avoid eating within 2-3 hours of going to bed to minimize reflux. Advised pt to sleep with head elevated to decrease reflux. Educated pt on the potential health complications that can occur related to uncontrolled GERD, including stomach ulcers and esophageal erosion/cancer. Educated pt on the potential for B12 deficiency related to acid reducing medication. ? ? ?Handouts Provided Include  ?GERD Nutrition Therapy Nutrition Care Manual ?NEW: Snack Sheet ? ?Learning Style & Readiness for Change ?Teaching method utilized: Visual & Auditory  ?Demonstrated degree of understanding via: Teach Back  ?Barriers to learning/adherence to lifestyle change: All or nothing outlook ? ?Goals Established by Pt ?Try adding some protein to your oatmeal by using Fairlife skim milk and add PB2 powdered peanut butter. ?Replace the Oikos Triple Zero yogurt with Skyr yogurt. A couple brands are Siggi's, and [place] Provisions. ?Add a small snack in between your breakfast and lunch, and lunch and dinner. Try 1 serving of Pop Corners and a reduced fat string cheese.  ?Pre-portion your snacks before bringing them with you and count the amount  of times that you chew each time you take a bite. ?Have a bottle of alkaline water after a meal if you feel your reflux is flaring up. ?Look into mental health providers that are covered on your insurance to work on stress  reduction. ? ?MONITORING & EVALUATION ?Dietary intake, weekly physical activity, and stress level in 2-3 months. ? ?Next Steps  ?Patient is to look into a mental health provider for stress releif, follow up with RD. ? ?

## 2021-11-08 NOTE — Patient Instructions (Addendum)
Try adding some protein to your oatmeal by using Fairlife skim milk and add PB2 powdered peanut butter. ? ?Replace the Oikos Triple Zero yogurt with Skyr yogurt. A couple brands are Siggi's, and [place] Provisions. ? ?Add a small snack in between your breakfast and lunch, and lunch and dinner. Try 1 serving of Pop Corners and a reduced fat string cheese.  ? ?Pre-portion your snacks before bringing them with you and count the amount of times that you chew each time you take a bite. ? ?Have a bottle of alkaline water after a meal if you feel your reflux is flaring up. ? ?Look into mental health providers that are covered on your insurance to work on stress reduction. ?

## 2022-01-11 ENCOUNTER — Ambulatory Visit: Payer: Managed Care, Other (non HMO) | Admitting: Dietician

## 2022-03-03 ENCOUNTER — Encounter: Payer: Self-pay | Admitting: Gastroenterology

## 2022-03-03 ENCOUNTER — Ambulatory Visit: Payer: Managed Care, Other (non HMO) | Admitting: Gastroenterology

## 2022-03-03 VITALS — BP 130/92 | HR 65 | Ht 68.0 in | Wt 185.0 lb

## 2022-03-03 DIAGNOSIS — K219 Gastro-esophageal reflux disease without esophagitis: Secondary | ICD-10-CM | POA: Diagnosis not present

## 2022-03-03 DIAGNOSIS — K824 Cholesterolosis of gallbladder: Secondary | ICD-10-CM | POA: Diagnosis not present

## 2022-03-03 DIAGNOSIS — Z86010 Personal history of colonic polyps: Secondary | ICD-10-CM | POA: Diagnosis not present

## 2022-03-03 NOTE — Progress Notes (Signed)
Chief Complaint:    GERD, epigastric pain, hiatal hernia  GI History: 51 year old male with a longstanding history of GERD, initially referred to me in 06/2019 by Dr. Tarri Glenn for consideration of TIF.   Previously followed with GI in [place], for GERD, with EGD in 2011 as below. Treated for H pylori at that time. Had been taking Prevacid since then, which initially worked well, but lost efficacy. No improvement with Nexium, Zantac, and has since reverted back to Prevacid QAM pre-breakfast. Generally works well, and takes Tums prn for breakthrough sxs (usually d/t dietary indiscretions), but wants to stop medications. Stopped coffee 10 years ago d/t reflux, and all caffeine in 2020. Never smoker/never drinker. Worse w/ chocolate, occasionally tomato based sauces. Rare nocturnal sxs.    GERD history: -Index symptoms: Heartburn, regurgitation, waterbrash, cough, raspy voice; occasional globus sensation; no dysphagia -Onset: Symptoms present since at least 2011 -Medications trialed: Prevacid OTC, Zantac, Nexium, Tums -Current medications: Prevacid 50 mg/day, Tums prn -Complications:    GERD evaluation: -EGD (09/2019, Dr. Bryan Lemma): Irregular Z-line, Hill grade 3 valve, mild gastritis -EGD: 2011 ([place]) -reported reflux changes and H. Pylori. No report in EMR -Barium esophagram (10/24/2019): Thickening esophageal folds c/w reflux, normal motility.  No hernia appreciated.  No reflux observed during study. -Esophageal Manometry:   -pH/Impedance:     He separately has a history of asymptomatic GB polyp, first diagnosed 9 years ago in [place].  Continues to have surveillance of the polyp after he moved to [place].  Relocated to [facility] in 2019, and repeat ultrasound with GB polyp slightly enlarged.  Previously followed for this with Dr. Tarri Glenn, with plan for repeat abdominal ultrasound in 2021, but no f/u appt since then.    Abdominal imaging: Abdominal ultrasound  03/07/2012: Normal no gallbladder polyps Abdominal ultrasound 2/12: 2 small gallbladder polyps Abdominal ultrasound 11/2013: 2 small gallbladder polyps Abdominal ultrasound 07/2015: 2 small gallbladder polyps Abdominal ultrasound 07/2016: 2 mm gallbladder polyp Abdominal ultrasound 08/02/2017: 3 mm gallbladder polyp and mild splenomegaly Abdominal ultrasound 05/02/19: 60m gallbladder polyp, no acute abnormalities   -Colonoscopy (09/2019, Dr. CBryan Lemma: 6 mm sessile serrated polyp.  4 mm rectal nodule (biopsy: Benign mucosa), Internal hemorrhoids.  Normal TI -Lower EUS (03/2020, Dr. MRush Landmark: Internal/external hemorrhoids, otherwise normal.  Repeat flexible sigmoidoscopy plus minus EUS in 1-2 years and if unremarkable, colonoscopy at 5 years  HPI:     Patient is a 51y.o. male presenting to the Gastroenterology Clinic for follow-up.  Last seen by me on 11/20/2019 to discuss antireflux surgical options.  Since then, has been essentially lost to follow-up.  Never completed Esophageal Manometry and pH/impedance testing.  Returns today for evaluation of GERD and to again discuss antireflux surgical options.  Reflux sxs relattively well controlled with Prevacid 15 mg daily, but he is not interested in antireflux surgical options as a means to better control reflux and potentially stop or significantly reduce the need for acid suppression therapy. Had not seen the surgeon for hernia/crural repair.   Isolated episode of epigastric pain last week which has since resolved and not returned.  He would also like to schedule RUQ [place] of known GB polyp.  Reviewed outside labs from 06/16/2021: - Normal CMP, CBC  No recent abdominal/chest imaging for review.   Review of systems:     No chest pain, no SOB, no fevers, no urinary sx   Past Medical History:  Diagnosis Date   Allergy    per pt  Anxiety    GERD (gastroesophageal reflux disease)    per pt    Patient's surgical history,  family medical history, social history, medications and allergies were all reviewed in Epic    Current Outpatient Medications  Medication Sig Dispense Refill   adapalene (DIFFERIN) 0.1 % gel Apply 1 application topically 3 (three) times a week.     fluticasone (FLONASE) 50 MCG/ACT nasal spray Place 1 spray into both nostrils at bedtime.     lansoprazole (PREVACID) 15 MG capsule Take 15 mg by mouth daily before breakfast.     No current facility-administered medications for this visit.    Physical Exam:     BP (!) 130/92   Pulse 65   Ht '5\' 8"'$  (1.727 m)   Wt 185 lb (83.9 kg)   BMI 28.13 kg/m   GENERAL:  Pleasant, conversive, in NAD PSYCH: : Cooperative, normal affect NEURO: Alert and oriented x 3, no focal neurologic deficits   IMPRESSION and PLAN:    1) GERD 2) Diaphragmatic defect Again discussed the pathophysiology of reflux along with treatment options to include continued medical management vs antireflux surgery.  He is interested in concomitant laparoscopic hernia repair/crural repair and TIF.  Does not want surgery until 2024.  - Continue acid suppression therapy - Continue antireflux lifestyle/dietary modifications - Referral to Dr. Redmond Pulling for consideration of cTIF - Will potentially plan for EM and pH/Mii (off PPI) in ealy 2024 pending eval by Dr. Redmond Pulling - Discussed postoperative dietary and exercise/activity limitations - Can follow-up with me after completion of studies and evaluation in surgical clinic  3) History of GB polyp - Repeat RUQ [place].  If GB polyp stable in size/appearance, likely does not need any further surveillance imaging - Can certainly discuss with Dr. Redmond Pulling whether or not cholecystectomy is indicated based on repeat ultrasound findings  4) Sessile serrated polyp - Repeat colonoscopy in 2026 for ongoing polyp surveillance  5) History of rectal nodule - Incidentally noted at time of index colonoscopy.  Nodule not present on subsequent flexible  sigmoidoscopy with EUS          Dominic Pea Carmelle Bamberg ,DO, FACG 03/03/2022, 3:24 PM

## 2022-03-03 NOTE — Patient Instructions (Signed)
_______________________________________________________  If you are age 50 or older, your body mass index should be between 23-30. Your Body mass index is 28.13 kg/m. If this is out of the aforementioned range listed, please consider follow up with your Primary Care Provider.  If you are age 39 or younger, your body mass index should be between 19-25. Your Body mass index is 28.13 kg/m. If this is out of the aformentioned range listed, please consider follow up with your Primary Care Provider.   We have sent a referral to [facility] and they will contact you to schedule an appointment.  You have been scheduled for an abdominal ultrasound at [facility] (1st floor of hospital) on 03/08/22 at 10:30am. Please arrive 30 minutes prior to your appointment for registration. Make certain not to have anything to eat or drink 6 hours prior to your appointment. Should you need to reschedule your appointment, please contact radiology at [phone]. This test typically takes about 30 minutes to perform.   The [facility] providers would like to encourage you to use [facility] to communicate with providers for non-urgent requests or questions.  Due to long hold times on the telephone, sending your provider a message by [facility] may be a faster and more efficient way to get a response.  Please allow 48 business hours for a response.  Please remember that this is for non-urgent requests.   It was a pleasure to see you today!  Thank you for trusting me with your gastrointestinal care!

## 2022-03-08 ENCOUNTER — Ambulatory Visit (HOSPITAL_COMMUNITY)
Admission: RE | Admit: 2022-03-08 | Discharge: 2022-03-08 | Disposition: A | Discharge status: Home / Self Care | Payer: Managed Care, Other (non HMO) | Source: Ambulatory Visit | Attending: Gastroenterology | Admitting: Gastroenterology

## 2022-03-08 ENCOUNTER — Telehealth: Payer: Self-pay

## 2022-03-08 DIAGNOSIS — K219 Gastro-esophageal reflux disease without esophagitis: Secondary | ICD-10-CM | POA: Diagnosis present

## 2022-03-08 DIAGNOSIS — K824 Cholesterolosis of gallbladder: Secondary | ICD-10-CM | POA: Insufficient documentation

## 2022-03-08 NOTE — Telephone Encounter (Signed)
Received call report from [facility] radiology. They wanted to make sure Dr. Bryan Lemma had viewed results from US Abdomen Limited RUQ (LIVER/GB) that was done today.

## 2022-03-08 NOTE — Telephone Encounter (Signed)
Ultrasound reviewed.  Please see separate message.  GB polyp has increased in size from 4 mm to 6.3 mm now.  Will place surgical referral.

## 2022-03-09 NOTE — Telephone Encounter (Signed)
See 03/08/22 [place] result note for details.

## 2022-08-15 NOTE — Progress Notes (Signed)
Sent message, via epic in basket, requesting order in epic from surgeon    08/15/22 0915  Preop Orders  Has preop orders? No  Name of staff/physician contacted for orders(Indicate phone or IB message) Ok Anis.  MD.

## 2022-08-18 NOTE — Progress Notes (Signed)
Second request for pre op orders spoke with: Mickel Baas

## 2022-08-21 ENCOUNTER — Ambulatory Visit: Payer: Self-pay | Admitting: General Surgery

## 2022-08-21 DIAGNOSIS — K824 Cholesterolosis of gallbladder: Secondary | ICD-10-CM

## 2022-08-21 NOTE — Progress Notes (Addendum)
COVID Vaccine Completed: yes  Date of COVID positive in last 90 days: no  PCP - Sueanne Margarita, DO Cardiologist - n/a  Chest x-ray - n/a EKG - n/a Stress Test - n/a ECHO - n/a Cardiac Cath - n/a Pacemaker/ICD device last checked: n/a Spinal Cord Stimulator: n/a  Bowel Prep - no  Sleep Study - n/a CPAP -   Fasting Blood Sugar - n/a Checks Blood Sugar _____ times a day  Last dose of GLP1 agonist-  N/A GLP1 instructions:  N/A   Last dose of SGLT-2 inhibitors-  N/A SGLT-2 instructions: N/A   Blood Thinner Instructions: n/a Aspirin Instructions: Last Dose:  Activity level: Can go up a flight of stairs and perform activities of daily living without stopping and without symptoms of chest pain or shortness of breath.    Anesthesia review:   Patient denies shortness of breath, fever, cough and chest pain at PAT appointment  Patient verbalized understanding of instructions that were given to them at the PAT appointment. Patient was also instructed that they will need to review over the PAT instructions again at home before surgery.

## 2022-08-21 NOTE — Patient Instructions (Addendum)
SURGICAL WAITING ROOM VISITATION  Patients having surgery or a procedure may have no more than 2 support people in the waiting area - these visitors may rotate.    Children under the age of 68 must have an adult with them who is not the patient.  Due to an increase in RSV and influenza rates and associated hospitalizations, children ages 44 and under may not visit patients in [place].  If the patient needs to stay at the hospital during part of their recovery, the visitor guidelines for inpatient rooms apply. Pre-op nurse will coordinate an appropriate time for 1 support person to accompany patient in pre-op.  This support person may not rotate.    Please refer to the [facility] website for the visitor guidelines for Inpatients (after your surgery is over and you are in a regular room).     Your procedure is scheduled on: 08/29/22   Report to [facility] Main Entrance    Report to admitting at 5:15 AM   Call this number if you have problems the morning of surgery [phone]   Do not eat food :After Midnight.   After Midnight you may have the following liquids until 4:30 AM DAY OF SURGERY  Water Non-Citrus Juices (without pulp, NO RED-Apple, White grape, White cranberry) Black Coffee (NO MILK/CREAM OR CREAMERS, sugar ok)  Clear Tea (NO MILK/CREAM OR CREAMERS, sugar ok) regular and decaf                             Plain Jell-O (NO RED)                                           Fruit ices (not with fruit pulp, NO RED)                                     Popsicles (NO RED)                                                               Sports drinks like Gatorade (NO RED)                 The day of surgery:  Drink ONE (1) Pre-Surgery Clear Ensure at 4:30 AM the morning of surgery. Drink in one sitting. Do not sip.  This drink was given to you during your hospital  pre-op appointment visit. Nothing else to drink after completing the  Pre-Surgery Clear  Ensure.          If you have questions, please contact your surgeon's office.   FOLLOW BOWEL PREP AND ANY ADDITIONAL PRE OP INSTRUCTIONS YOU RECEIVED FROM YOUR SURGEON'S OFFICE!!!     Oral Hygiene is also important to reduce your risk of infection.                                    Remember - BRUSH YOUR TEETH THE MORNING OF SURGERY WITH YOUR REGULAR TOOTHPASTE  DENTURES  WILL BE REMOVED PRIOR TO SURGERY PLEASE DO NOT APPLY "Poly grip" OR ADHESIVES!!!   Take these medicines the morning of surgery with A SIP OF WATER: Prevacid                               You may not have any metal on your body including jewelry, and body piercing             Do not wear lotions, powders, cologne, or deodorant              Men may shave face and neck.   Do not bring valuables to the hospital. [place].   Contacts, glasses, dentures or bridgework may not be worn into surgery.  DO NOT [place]. PHARMACY WILL DISPENSE MEDICATIONS LISTED ON YOUR MEDICATION LIST TO YOU DURING YOUR ADMISSION [place]!    Patients discharged on the day of surgery will not be allowed to drive home.  Someone NEEDS to stay with you for the first 24 hours after anesthesia.   Special Instructions: Bring a copy of your healthcare power of attorney and living will documents the day of surgery if you haven't scanned them before.              Please read over the following fact sheets you were given: IF [place] [phone] Schneiders    If you received a COVID test during your pre-op visit  it is requested that you wear a mask when out in public, stay away from anyone that may not be feeling well and notify your surgeon if you develop symptoms. If you test positive for Covid or have been in contact with anyone that has tested positive in the last 10 days please notify you surgeon.     [place] - Preparing for Surgery Before surgery, you can play an important role.  Because skin is not sterile, your skin needs to be as free of germs as possible.  You can reduce the number of germs on your skin by washing with CHG (chlorahexidine gluconate) soap before surgery.  CHG is an antiseptic cleaner which kills germs and bonds with the skin to continue killing germs even after washing. Please DO NOT use if you have an allergy to CHG or antibacterial soaps.  If your skin becomes reddened/irritated stop using the CHG and inform your nurse when you arrive at Short Stay. Do not shave (including legs and underarms) for at least 48 hours prior to the first CHG shower.  You may shave your face/neck.  Please follow these instructions carefully:  1.  Shower with CHG Soap the night before surgery and the  morning of surgery.  2.  If you choose to wash your hair, wash your hair first as usual with your normal  shampoo.  3.  After you shampoo, rinse your hair and body thoroughly to remove the shampoo.                             4.  Use CHG as you would any other liquid soap.  You can apply chg directly to the skin and wash.  Gently with a scrungie or clean washcloth.  5.  Apply the CHG Soap to your body ONLY FROM THE NECK DOWN.   Do   not use on face/ open                           Wound or open sores. Avoid contact with eyes, ears mouth and   genitals (private parts).                       Wash face,  Genitals (private parts) with your normal soap.             6.  Wash thoroughly, paying special attention to the area where your    surgery  will be performed.  7.  Thoroughly rinse your body with warm water from the neck down.  8.  DO NOT shower/wash with your normal soap after using and rinsing off the CHG Soap.                9.  Pat yourself dry with a clean towel.            10.  Wear clean pajamas.            11.  Place clean sheets on your bed the night of your first shower and do not  sleep  with pets. Day of Surgery : Do not apply any lotions/deodorants the morning of surgery.  Please wear clean clothes to the hospital/surgery center.  FAILURE TO FOLLOW THESE INSTRUCTIONS MAY RESULT IN THE CANCELLATION OF YOUR SURGERY  PATIENT SIGNATURE_________________________________  NURSE SIGNATURE__________________________________  ________________________________________________________________________

## 2022-08-22 ENCOUNTER — Encounter (HOSPITAL_COMMUNITY): Payer: Self-pay

## 2022-08-22 ENCOUNTER — Encounter (HOSPITAL_COMMUNITY)
Admission: RE | Admit: 2022-08-22 | Discharge: 2022-08-22 | Disposition: A | Discharge status: Home / Self Care | Payer: Managed Care, Other (non HMO) | Source: Ambulatory Visit | Attending: General Surgery | Admitting: General Surgery

## 2022-08-22 DIAGNOSIS — Z01812 Encounter for preprocedural laboratory examination: Secondary | ICD-10-CM | POA: Diagnosis not present

## 2022-08-22 DIAGNOSIS — K824 Cholesterolosis of gallbladder: Secondary | ICD-10-CM | POA: Insufficient documentation

## 2022-08-22 LAB — CBC WITH DIFFERENTIAL/PLATELET
Abs Immature Granulocytes: 0.01 10*3/uL (ref 0.00–0.07)
Basophils Absolute: 0 10*3/uL (ref 0.0–0.1)
Basophils Relative: 1 %
Eosinophils Absolute: 0.1 10*3/uL (ref 0.0–0.5)
Eosinophils Relative: 1 %
HCT: 47.6 % (ref 39.0–52.0)
Hemoglobin: 16.1 g/dL (ref 13.0–17.0)
Immature Granulocytes: 0 %
Lymphocytes Relative: 35 %
Lymphs Abs: 1.8 10*3/uL (ref 0.7–4.0)
MCH: 30.2 pg (ref 26.0–34.0)
MCHC: 33.8 g/dL (ref 30.0–36.0)
MCV: 89.3 fL (ref 80.0–100.0)
Monocytes Absolute: 0.4 10*3/uL (ref 0.1–1.0)
Monocytes Relative: 7 %
Neutro Abs: 2.8 10*3/uL (ref 1.7–7.7)
Neutrophils Relative %: 56 %
Platelets: 201 10*3/uL (ref 150–400)
RBC: 5.33 MIL/uL (ref 4.22–5.81)
RDW: 12 % (ref 11.5–15.5)
WBC: 5.1 10*3/uL (ref 4.0–10.5)
nRBC: 0 % (ref 0.0–0.2)

## 2022-08-22 LAB — COMPREHENSIVE METABOLIC PANEL
ALT: 35 U/L (ref 0–44)
AST: 25 U/L (ref 15–41)
Albumin: 4.4 g/dL (ref 3.5–5.0)
Alkaline Phosphatase: 76 U/L (ref 38–126)
Anion gap: 8 (ref 5–15)
BUN: 16 mg/dL (ref 6–20)
CO2: 26 mmol/L (ref 22–32)
Calcium: 9.1 mg/dL (ref 8.9–10.3)
Chloride: 103 mmol/L (ref 98–111)
Creatinine, Ser: 0.87 mg/dL (ref 0.61–1.24)
GFR, Estimated: >60 mL/min (ref >60)
Glucose, Bld: 104 mg/dL — ABNORMAL HIGH (ref 70–99)
Potassium: 4.1 mmol/L (ref 3.5–5.1)
Sodium: 137 mmol/L (ref 135–145)
Total Bilirubin: 0.7 mg/dL (ref 0.3–1.2)
Total Protein: 7.1 g/dL (ref 6.5–8.1)

## 2022-08-29 ENCOUNTER — Other Ambulatory Visit: Payer: Self-pay

## 2022-08-29 ENCOUNTER — Encounter (HOSPITAL_COMMUNITY): Payer: Self-pay | Admitting: General Surgery

## 2022-08-29 ENCOUNTER — Encounter (HOSPITAL_COMMUNITY)
Admission: RE | Disposition: A | Discharge status: Home / Self Care | Payer: Self-pay | Source: Home / Self Care | Attending: General Surgery

## 2022-08-29 ENCOUNTER — Ambulatory Visit (HOSPITAL_BASED_OUTPATIENT_CLINIC_OR_DEPARTMENT_OTHER): Payer: Managed Care, Other (non HMO) | Admitting: Certified Registered"

## 2022-08-29 ENCOUNTER — Ambulatory Visit (HOSPITAL_COMMUNITY)
Admission: RE | Admit: 2022-08-29 | Discharge: 2022-08-29 | Disposition: A | Discharge status: Home / Self Care | Payer: Managed Care, Other (non HMO) | Attending: General Surgery | Admitting: General Surgery

## 2022-08-29 ENCOUNTER — Ambulatory Visit (HOSPITAL_COMMUNITY): Payer: Managed Care, Other (non HMO) | Admitting: Certified Registered"

## 2022-08-29 DIAGNOSIS — K219 Gastro-esophageal reflux disease without esophagitis: Secondary | ICD-10-CM | POA: Diagnosis not present

## 2022-08-29 DIAGNOSIS — K824 Cholesterolosis of gallbladder: Secondary | ICD-10-CM | POA: Diagnosis present

## 2022-08-29 DIAGNOSIS — K811 Chronic cholecystitis: Secondary | ICD-10-CM | POA: Insufficient documentation

## 2022-08-29 HISTORY — PX: CHOLECYSTECTOMY: SHX55

## 2022-08-29 SURGERY — LAPAROSCOPIC CHOLECYSTECTOMY
Anesthesia: General

## 2022-08-29 MED ORDER — PROPOFOL 10 MG/ML IV BOLUS
INTRAVENOUS | Status: DC | PRN
  Administered 2022-08-29: 200 mg via INTRAVENOUS

## 2022-08-29 MED ORDER — OXYCODONE HCL 5 MG PO TABS: 5.0000 mg | ORAL_TABLET | Freq: Four times a day (QID) | ORAL | 0 refills | Status: DC | PRN

## 2022-08-29 MED ORDER — BUPIVACAINE HCL (PF) 0.25 % IJ SOLN
INTRAMUSCULAR | Status: AC
  Filled 2022-08-29: qty 30

## 2022-08-29 MED ORDER — 0.9 % SODIUM CHLORIDE (POUR BTL) OPTIME
TOPICAL | Status: DC | PRN
  Administered 2022-08-29: 1000 mL

## 2022-08-29 MED ORDER — PROPOFOL 10 MG/ML IV BOLUS
INTRAVENOUS | Status: AC
  Filled 2022-08-29: qty 20

## 2022-08-29 MED ORDER — FENTANYL CITRATE (PF) 100 MCG/2ML IJ SOLN
INTRAMUSCULAR | Status: DC | PRN
  Administered 2022-08-29: 100 ug via INTRAVENOUS
  Administered 2022-08-29: 50 ug via INTRAVENOUS

## 2022-08-29 MED ORDER — ONDANSETRON HCL 4 MG/2ML IJ SOLN
INTRAMUSCULAR | Status: DC | PRN
  Administered 2022-08-29: 4 mg via INTRAVENOUS

## 2022-08-29 MED ORDER — SUGAMMADEX SODIUM 200 MG/2ML IV SOLN
INTRAVENOUS | Status: DC | PRN
  Administered 2022-08-29: 180 mg via INTRAVENOUS

## 2022-08-29 MED ORDER — FENTANYL CITRATE (PF) 100 MCG/2ML IJ SOLN
INTRAMUSCULAR | Status: AC
  Filled 2022-08-29: qty 2

## 2022-08-29 MED ORDER — BUPIVACAINE HCL (PF) 0.25 % IJ SOLN
INTRAMUSCULAR | Status: DC | PRN
  Administered 2022-08-29: 30 mL

## 2022-08-29 MED ORDER — ACETAMINOPHEN 500 MG PO TABS
1000.0000 mg | ORAL_TABLET | ORAL | Status: AC
  Administered 2022-08-29: 1000 mg via ORAL
  Filled 2022-08-29: qty 2

## 2022-08-29 MED ORDER — MIDAZOLAM HCL 2 MG/2ML IJ SOLN
INTRAMUSCULAR | Status: AC
  Filled 2022-08-29: qty 2

## 2022-08-29 MED ORDER — MEPERIDINE HCL 50 MG/ML IJ SOLN: 6.2500 mg | INTRAMUSCULAR | Status: DC | PRN

## 2022-08-29 MED ORDER — CHLORHEXIDINE GLUCONATE CLOTH 2 % EX PADS: 6.0000 | MEDICATED_PAD | Freq: Once | CUTANEOUS | Status: DC

## 2022-08-29 MED ORDER — KETOROLAC TROMETHAMINE 30 MG/ML IJ SOLN: 30.0000 mg | Freq: Once | INTRAMUSCULAR | Status: DC | PRN

## 2022-08-29 MED ORDER — LACTATED RINGERS IV SOLN: INTRAVENOUS | Status: DC

## 2022-08-29 MED ORDER — LIDOCAINE 2% (20 MG/ML) 5 ML SYRINGE
INTRAMUSCULAR | Status: DC | PRN
  Administered 2022-08-29: 100 mg via INTRAVENOUS

## 2022-08-29 MED ORDER — ROCURONIUM BROMIDE 10 MG/ML (PF) SYRINGE
PREFILLED_SYRINGE | INTRAVENOUS | Status: AC
  Filled 2022-08-29: qty 10

## 2022-08-29 MED ORDER — ONDANSETRON HCL 4 MG/2ML IJ SOLN
INTRAMUSCULAR | Status: AC
  Filled 2022-08-29: qty 2

## 2022-08-29 MED ORDER — ORAL CARE MOUTH RINSE: 15.0000 mL | Freq: Once | OROMUCOSAL | Status: AC

## 2022-08-29 MED ORDER — DEXAMETHASONE SODIUM PHOSPHATE 10 MG/ML IJ SOLN
INTRAMUSCULAR | Status: DC | PRN
  Administered 2022-08-29: 8 mg via INTRAVENOUS

## 2022-08-29 MED ORDER — DEXAMETHASONE SODIUM PHOSPHATE 10 MG/ML IJ SOLN
INTRAMUSCULAR | Status: AC
  Filled 2022-08-29: qty 1

## 2022-08-29 MED ORDER — KETOROLAC TROMETHAMINE 30 MG/ML IJ SOLN
INTRAMUSCULAR | Status: DC | PRN
  Administered 2022-08-29: 30 mg via INTRAVENOUS

## 2022-08-29 MED ORDER — ACETAMINOPHEN 500 MG PO TABS: 1000.0000 mg | ORAL_TABLET | Freq: Three times a day (TID) | ORAL | 0 refills | Status: AC

## 2022-08-29 MED ORDER — CHLORHEXIDINE GLUCONATE 0.12 % MT SOLN
15.0000 mL | Freq: Once | OROMUCOSAL | Status: AC
  Administered 2022-08-29: 15 mL via OROMUCOSAL

## 2022-08-29 MED ORDER — HYDROMORPHONE HCL 1 MG/ML IJ SOLN: 0.2500 mg | INTRAMUSCULAR | Status: DC | PRN

## 2022-08-29 MED ORDER — ONDANSETRON HCL 4 MG/2ML IJ SOLN: 4.0000 mg | Freq: Once | INTRAMUSCULAR | Status: DC | PRN

## 2022-08-29 MED ORDER — ROCURONIUM BROMIDE 10 MG/ML (PF) SYRINGE
PREFILLED_SYRINGE | INTRAVENOUS | Status: DC | PRN
  Administered 2022-08-29: 70 mg via INTRAVENOUS
  Administered 2022-08-29: 20 mg via INTRAVENOUS

## 2022-08-29 MED ORDER — LACTATED RINGERS IR SOLN
Status: DC | PRN
  Administered 2022-08-29: 1000 mL

## 2022-08-29 MED ORDER — MIDAZOLAM HCL 2 MG/2ML IJ SOLN
INTRAMUSCULAR | Status: DC | PRN
  Administered 2022-08-29: 2 mg via INTRAVENOUS

## 2022-08-29 MED ORDER — CIPROFLOXACIN IN D5W 400 MG/200ML IV SOLN
400.0000 mg | INTRAVENOUS | Status: AC
  Administered 2022-08-29: 400 mg via INTRAVENOUS
  Filled 2022-08-29: qty 200

## 2022-08-29 MED ORDER — KETOROLAC TROMETHAMINE 30 MG/ML IJ SOLN
INTRAMUSCULAR | Status: AC
  Filled 2022-08-29: qty 1

## 2022-08-29 SURGICAL SUPPLY — 48 items
APPLICATOR ARISTA FLEXITIP XL (MISCELLANEOUS) IMPLANT
APPLIER CLIP 5 13 M/L LIGAMAX5 (MISCELLANEOUS) ×1
APPLIER CLIP ROT 10 11.4 M/L (STAPLE)
BAG COUNTER SPONGE SURGICOUNT (BAG) IMPLANT
CABLE HIGH FREQUENCY MONO STRZ (ELECTRODE) ×1 IMPLANT
CHLORAPREP W/TINT 26 (MISCELLANEOUS) ×1 IMPLANT
CLIP APPLIE 5 13 M/L LIGAMAX5 (MISCELLANEOUS) IMPLANT
CLIP APPLIE ROT 10 11.4 M/L (STAPLE) IMPLANT
CLIP LIGATING HEMO O LOK GREEN (MISCELLANEOUS) IMPLANT
COVER MAYO STAND XLG (MISCELLANEOUS) IMPLANT
COVER SURGICAL LIGHT HANDLE (MISCELLANEOUS) ×1 IMPLANT
DERMABOND ADVANCED .7 DNX12 (GAUZE/BANDAGES/DRESSINGS) IMPLANT
DRAPE C-ARM 42X120 X-RAY (DRAPES) IMPLANT
DRSG TEGADERM 2-3/8X2-3/4 SM (GAUZE/BANDAGES/DRESSINGS) ×3 IMPLANT
DRSG TEGADERM 4X4.75 (GAUZE/BANDAGES/DRESSINGS) ×1 IMPLANT
ELECT REM PT RETURN 15FT ADLT (MISCELLANEOUS) ×1 IMPLANT
GAUZE SPONGE 2X2 STRL 8-PLY (GAUZE/BANDAGES/DRESSINGS) ×1 IMPLANT
GLOVE BIO SURGEON STRL SZ7.5 (GLOVE) ×1 IMPLANT
GLOVE INDICATOR 8.0 STRL GRN (GLOVE) ×1 IMPLANT
GOWN STRL REUS W/ TWL XL LVL3 (GOWN DISPOSABLE) ×1 IMPLANT
GOWN STRL REUS W/TWL XL LVL3 (GOWN DISPOSABLE) ×1
GRASPER SUT TROCAR 14GX15 (MISCELLANEOUS) IMPLANT
HEMOSTAT ARISTA ABSORB 3G PWDR (HEMOSTASIS) IMPLANT
HEMOSTAT SNOW SURGICEL 2X4 (HEMOSTASIS) IMPLANT
IRRIG SUCT STRYKERFLOW 2 WTIP (MISCELLANEOUS) ×1
IRRIGATION SUCT STRKRFLW 2 WTP (MISCELLANEOUS) ×1 IMPLANT
KIT BASIN OR (CUSTOM PROCEDURE TRAY) ×1 IMPLANT
KIT TURNOVER KIT A (KITS) IMPLANT
L-HOOK LAP DISP 36CM (ELECTROSURGICAL)
LHOOK LAP DISP 36CM (ELECTROSURGICAL) IMPLANT
POUCH RETRIEVAL ECOSAC 10 (ENDOMECHANICALS) ×1 IMPLANT
POUCH RETRIEVAL ECOSAC 10MM (ENDOMECHANICALS) ×1
SCISSORS LAP 5X35 DISP (ENDOMECHANICALS) ×1 IMPLANT
SET CHOLANGIOGRAPH MIX (MISCELLANEOUS) IMPLANT
SET TUBE SMOKE EVAC HIGH FLOW (TUBING) ×1 IMPLANT
SLEEVE Z-THREAD 5X100MM (TROCAR) ×2 IMPLANT
SPIKE FLUID TRANSFER (MISCELLANEOUS) ×1 IMPLANT
STRIP CLOSURE SKIN 1/2X4 (GAUZE/BANDAGES/DRESSINGS) ×1 IMPLANT
SUT MNCRL AB 4-0 PS2 18 (SUTURE) ×1 IMPLANT
SUT VIC AB 0 UR5 27 (SUTURE) IMPLANT
SUT VICRYL 0 TIES 12 18 (SUTURE) IMPLANT
SUT VICRYL 0 UR6 27IN ABS (SUTURE) IMPLANT
TOWEL OR 17X26 10 PK STRL BLUE (TOWEL DISPOSABLE) ×1 IMPLANT
TOWEL OR NON WOVEN STRL DISP B (DISPOSABLE) ×1 IMPLANT
TRAY LAPAROSCOPIC (CUSTOM PROCEDURE TRAY) ×1 IMPLANT
TROCAR 11X100 Z THREAD (TROCAR) IMPLANT
TROCAR BALLN 12MMX100 BLUNT (TROCAR) ×1 IMPLANT
TROCAR Z-THREAD OPTICAL 5X100M (TROCAR) ×1 IMPLANT

## 2022-08-29 NOTE — Op Note (Signed)
Benjamin Ho [id number] 11/17/1970 08/29/2022  Laparoscopic Cholecystectomy Procedure Note  Indications: This patient presents with enlarging gallbladder polyp and will undergo laparoscopic cholecystectomy.  Pre-operative Diagnosis: gallbladder polyp  Post-operative Diagnosis: gallbladder polyp  Surgeon: Greer Pickerel MD FACS  Surgery Resident: Eben Burow PGY-4  Anesthesia: General endotracheal anesthesia  Procedure Details  The patient was seen again in the Holding Room. The risks, benefits, complications, treatment options, and expected outcomes were discussed with the patient. The possibilities of reaction to medication, pulmonary aspiration, perforation of viscus, bleeding, recurrent infection, finding a normal gallbladder, the need for additional procedures, failure to diagnose a condition, the possible need to convert to an open procedure, and creating a complication requiring transfusion or operation were discussed with the patient. The likelihood of improving the patient's symptoms with return to their baseline status is good.  The patient and/or family concurred with the proposed plan, giving informed consent. The site of surgery properly noted. The patient was taken to Operating Room, identified as Elison Worrel and the procedure verified as Laparoscopic Cholecystectomy. A Time Out was held and the above information confirmed. Antibiotic prophylaxis was administered.   Prior to the induction of general anesthesia, antibiotic prophylaxis was administered. General endotracheal anesthesia was then administered and tolerated well. After the induction, the abdomen was prepped with Chloraprep and draped in the sterile fashion. The patient was positioned in the supine position.  Local anesthetic agent was injected into the skin near the umbilicus and an incision made. We dissected down to the abdominal fascia with blunt dissection.  The fascia  was incised vertically and we entered the peritoneal cavity bluntly.  A pursestring suture of 0-Vicryl was placed around the fascial opening.  The Hasson cannula was inserted and secured with the stay suture.  Pneumoperitoneum was then created with CO2 and tolerated well without any adverse changes in the patient's vital signs. An 5-mm port was placed in the subxiphoid position.  Two 5-mm ports were placed in the right upper quadrant. All skin incisions were infiltrated with a local anesthetic agent before making the incision and placing the trocars.   We positioned the patient in reverse Trendelenburg, tilted slightly to the patient's left.  The gallbladder was identified, the fundus grasped and retracted cephalad. Adhesions were lysed bluntly and with the electrocautery where indicated, taking care not to injure any adjacent organs or viscus. The infundibulum was grasped and retracted laterally, exposing the peritoneum overlying the triangle of Calot. This was then divided and exposed in a blunt fashion. A critical view of the cystic duct and cystic artery was obtained.  The cystic duct was clearly identified and bluntly dissected circumferentially.  The cystic duct was then ligated with clips and divided. The cystic artery which had been identified & dissected free was ligated with clips and divided as well.   The gallbladder was dissected from the liver bed in retrograde fashion with the electrocautery. The gallbladder was removed and placed in an Ecco sac.  The gallbladder and Ecco sac were then removed through the umbilical port site. The liver bed was irrigated and inspected. Hemostasis was achieved with the electrocautery. Copious irrigation was utilized and was repeatedly aspirated until clear.  The pursestring suture was used to close the umbilical fascia.  An additional interrupted 0 Vicryl was placed at the umbilical fashion using the PMI suture passer.  We again inspected the right upper  quadrant for hemostasis.  The umbilical closure was inspected and there was no air  leak and nothing trapped within the closure. Pneumoperitoneum was released as we removed the trocars.  4-0 Monocryl was used to close the skin.    steri-strips, and clean dressings were applied. The patient was then extubated and brought to the recovery room in stable condition. Instrument, sponge, and needle counts were correct at closure and at the conclusion of the case.   Findings: +critical view Inspected the hiatus.  There is no large hiatal hernia.  At most the patient had a small sliding hiatal hernia.  Estimated Blood Loss: Minimal         Drains: none         Specimens: Gallbladder           Complications: None; patient tolerated the procedure well.         Disposition: PACU - hemodynamically stable.         Condition: stable  I was personally present, scrubbed and performed all portions of the procedure but the resident assisted with skin closure.   Leighton Ruff. Redmond Pulling, MD, FACS General, Bariatric, & Minimally Invasive Surgery [facility],  [place]

## 2022-08-29 NOTE — Anesthesia Postprocedure Evaluation (Signed)
Anesthesia Post Note  Patient: Backus  Procedure(s) Performed: LAPAROSCOPIC CHOLECYSTECTOMY     Patient location during evaluation: Endoscopy Anesthesia Type: General Level of consciousness: awake Pain management: pain level controlled Vital Signs Assessment: post-procedure vital signs reviewed and stable Respiratory status: spontaneous breathing Cardiovascular status: stable Postop Assessment: no apparent nausea or vomiting Anesthetic complications: no  No notable events documented.  Last Vitals:  Vitals:   08/29/22 0915 08/29/22 1000  BP: 118/80 128/79  Pulse: (!) 56 (!) 55  Resp: 18 (!) 22  Temp:    SpO2: 98% 98%    Last Pain:  Vitals:   08/29/22 1000  TempSrc:   PainSc: 0-No pain                 John F Salome Arnt

## 2022-08-29 NOTE — Anesthesia Preprocedure Evaluation (Addendum)
Anesthesia Evaluation  Patient identified by MRN, date of birth, ID band Patient awake    Reviewed: Allergy & Precautions, NPO status , Patient's Chart, lab work & pertinent test results  Airway Mallampati: I       Dental no notable dental hx.    Pulmonary neg pulmonary ROS   Pulmonary exam normal        Cardiovascular Normal cardiovascular exam     Neuro/Psych   Anxiety     negative neurological ROS     GI/Hepatic Neg liver ROS,GERD  Medicated and Controlled,,  Endo/Other  negative endocrine ROS    Renal/GU negative Renal ROS  negative genitourinary   Musculoskeletal negative musculoskeletal ROS (+)    Abdominal Normal abdominal exam  (+)   Peds  Hematology negative hematology ROS (+)   Anesthesia Other Findings   Reproductive/Obstetrics                             Anesthesia Physical Anesthesia Plan  ASA: II  Anesthesia Plan: General   Post-op Pain Management:    Induction: Intravenous  PONV Risk Score and Plan: 3 and Treatment may vary due to age or medical condition  Airway Management Planned:   Additional Equipment:   Intra-op Plan:   Post-operative Plan: Extubation in OR  Informed Consent: I have reviewed the patients History and Physical, chart, labs and discussed the procedure including the risks, benefits and alternatives for the proposed anesthesia with the patient or authorized representative who has indicated his/her understanding and acceptance.     Dental advisory given  Plan Discussed with: CRNA and Surgeon  Anesthesia Plan Comments:         Anesthesia Quick Evaluation

## 2022-08-29 NOTE — Anesthesia Procedure Notes (Signed)
Procedure Name: Intubation Date/Time: 08/29/2022 7:34 AM  Performed by: Eben Burow, CRNAPre-anesthesia Checklist: Patient identified, Emergency Drugs available, Suction available, Patient being monitored and Timeout performed Patient Re-evaluated:Patient Re-evaluated prior to induction Oxygen Delivery Method: Circle system utilized Preoxygenation: Pre-oxygenation with 100% oxygen Induction Type: IV induction Ventilation: Mask ventilation without difficulty Laryngoscope Size: Mac and 4 Grade View: Grade I Tube type: Oral Tube size: 7.5 mm Number of attempts: 1 Airway Equipment and Method: Stylet Placement Confirmation: ETT inserted through vocal cords under direct vision, positive ETCO2 and breath sounds checked- equal and bilateral Secured at: 23 cm Tube secured with: Tape Dental Injury: Teeth and Oropharynx as per pre-operative assessment

## 2022-08-29 NOTE — H&P (Signed)
CC: here for surgery  Requesting provider: dr Bryan Lemma  HPI: Benjamin Ho is an 52 y.o. male who is here for laparoscopic cholecystectomy. Denies any changes. Changing up the reflux meds.   Old hpi: Benjamin Ho is a 52 y.o. male who is seen today as an office consultation at the request of Dr. Bryan Lemma for evaluation of gallbladder polyp. He also chronic GERD issues. He would like to discuss both his gallbladder polyp and his GERD. He denies any abdominal pain. He denies any right upper quadrant or postprandial pain. No nausea or vomiting. No diarrhea or constipation. He had 1 episode of epigastric discomfort the other week and he took some ibuprofen for 2 nights around when it resolved it. He has had surveillance ultrasounds for the past few years for a known gallbladder polyp. It most recently increased 2 mm in size.  He also has chronic GERD however he is GERD is very well controlled he states. He takes Prevacid daily. He states that it does an excellent job controlling his GERD. He does not really have any breakthrough symptoms. If he does not take his GERD he may have a cough. He states that his primary symptom if he is not taking his Prevacid is coughing a lot. He is very cautious about what he eats because there are certain foods that will trigger it. He denies any dysphagia. He may have some early satiety at times. He states he does not eat a lot. He does not eat late either. If he overeats he states he will have a "grease "sensation in his throat. He sleeps flat.   Past Medical History:  Diagnosis Date   Allergy    per pt   Anxiety    GERD (gastroesophageal reflux disease)    per pt    Past Surgical History:  Procedure Laterality Date   EUS N/A 04/26/2020   Procedure: LOWER ENDOSCOPIC ULTRASOUND (EUS);  Surgeon: Irving Copas., MD;  Location: [place];  Service: Gastroenterology;  Laterality: N/A;   FINGER SURGERY     FLEXIBLE SIGMOIDOSCOPY  N/A 04/26/2020   Procedure: FLEXIBLE SIGMOIDOSCOPY;  Surgeon: Rush Landmark Telford Nab., MD;  Location: [place];  Service: Gastroenterology;  Laterality: N/A;   mallet finger     pinky right hand   UPPER GASTROINTESTINAL ENDOSCOPY     8-10 yrs ago per pt.   WISDOM TOOTH EXTRACTION Bilateral 2022    Family History  Problem Relation Age of Onset   Diabetes Mother    Hypertension Father    Thyroid disease Sister    Lung cancer Maternal Grandfather        smoker   Cancer Paternal Grandfather        stomach or intestinal   Cancer Cousin        colon vs stomach per pt   Esophageal cancer Neg Hx    Colon cancer Neg Hx    Colon polyps Neg Hx    Rectal cancer Neg Hx    Stomach cancer Neg Hx     Social:  reports that he has never smoked. He has never used smokeless tobacco. He reports that he does not drink alcohol and does not use drugs.  Allergies:  Allergies  Allergen Reactions   Cefadroxil Swelling and Rash    Medications: I have reviewed the patient's current medications.   ROS - all of the below systems have been reviewed with the patient and positives are indicated with bold text General: chills, fever or night  sweats Eyes: blurry vision or double vision ENT: epistaxis or sore throat Allergy/Immunology: itchy/watery eyes or nasal congestion Hematologic/Lymphatic: bleeding problems, blood clots or swollen lymph nodes Endocrine: temperature intolerance or unexpected weight changes Breast: new or changing breast lumps or nipple discharge Resp: cough, shortness of breath, or wheezing CV: chest pain or dyspnea on exertion GI: as per HPI GU: dysuria, trouble voiding, or hematuria MSK: joint pain or joint stiffness Neuro: TIA or stroke symptoms Derm: pruritus and skin lesion changes Psych: anxiety and depression  PE Blood pressure (!) 122/91, pulse 70, temperature 98.5 F (36.9 C), temperature source Oral, resp. rate 15, height 5' 8.5" (1.74 m), weight 77 kg, SpO2 97  %. Constitutional: NAD; conversant; no deformities Eyes: Moist conjunctiva; no lid lag; anicteric; PERRL Neck: Trachea midline; no thyromegaly Lungs: Normal respiratory effort; no tactile fremitus CV: RRR; no palpable thrills; no pitting edema GI: Abd soft, nt; no palpable hepatosplenomegaly MSK: Normal gait; no clubbing/cyanosis Psychiatric: Appropriate affect; alert and oriented x3 Lymphatic: No palpable cervical or axillary lymphadenopathy Skin:no rash/lesions/jaundice  No results found for this or any previous visit (from the past 4 hour(s)).  No results found.  Imaging: reviewed  A/P: Benjamin Ho is an 52 y.o. male with gallbladder polyp Here for lap chole for enlarging gb Eras Iv abx  Leighton Ruff. Redmond Pulling, MD, FACS General, Bariatric, & Minimally Invasive Surgery [place]

## 2022-08-29 NOTE — Transfer of Care (Signed)
Immediate Anesthesia Transfer of Care Note  Patient: Benjamin Ho  Procedure(s) Performed: LAPAROSCOPIC CHOLECYSTECTOMY  Patient Location: PACU  Anesthesia Type:General  Level of Consciousness: awake, drowsy, and patient cooperative  Airway & Oxygen Therapy: Patient Spontanous Breathing and Patient connected to face mask oxygen  Post-op Assessment: Report given to RN and Post -op Vital signs reviewed and stable  Post vital signs: Reviewed and stable  Last Vitals:  Vitals Value Taken Time  BP 140/102 08/29/22 0840  Temp    Pulse 64 08/29/22 0841  Resp 11 08/29/22 0841  SpO2 100 % 08/29/22 0841  Vitals shown include unvalidated device data.  Last Pain:  Vitals:   08/29/22 0557  TempSrc: Oral  PainSc: 0-No pain         Complications: No notable events documented.

## 2022-08-29 NOTE — Discharge Instructions (Signed)
[facility] LAPAROSCOPIC SURGERY: POST OP INSTRUCTIONS Always review your discharge instruction sheet given to you by the facility where your surgery was performed. IF YOU HAVE DISABILITY OR FAMILY LEAVE FORMS, YOU MUST BRING THEM TO THE OFFICE FOR PROCESSING.   DO NOT GIVE THEM TO YOUR DOCTOR.  PAIN CONTROL  First take acetaminophen (Tylenol) AND/or ibuprofen (Advil) to control your pain after surgery.  Follow directions on package.  Taking acetaminophen (Tylenol) and/or ibuprofen (Advil) regularly after surgery will help to control your pain and lower the amount of prescription pain medication you may need.  You should not take more than 3,000 mg (3 grams) of acetaminophen (Tylenol) in 24 hours.  You should not take ibuprofen (Advil), aleve, motrin, naprosyn or other NSAIDS if you have a history of stomach ulcers or chronic kidney disease.  A prescription for pain medication may be given to you upon discharge.  Take your pain medication as prescribed, if you still have uncontrolled pain after taking acetaminophen (Tylenol) or ibuprofen (Advil). Use ice packs to help control pain. If you need a refill on your pain medication, please contact your pharmacy.  They will contact our office to request authorization. Prescriptions will not be filled after 5pm or on week-ends.  HOME MEDICATIONS Take your usually prescribed medications unless otherwise directed.  DIET You should follow a light diet the first few days after arrival home.  Be sure to include lots of fluids daily. Avoid fatty, fried foods.   CONSTIPATION It is common to experience some constipation after surgery and if you are taking pain medication.  Increasing fluid intake and taking a stool softener (such as Colace) will usually help or prevent this problem from occurring.  A mild laxative (Milk of Magnesia or Miralax) should be taken according to package instructions if there are no bowel movements after 48  hours.  WOUND/INCISION CARE Most patients will experience some swelling and bruising in the area of the incisions.  Ice packs will help.  Swelling and bruising can take several days to resolve.  Unless discharge instructions indicate otherwise, follow guidelines below  STERI-STRIPS - you may remove your outer bandages 48 hours after surgery, and you may shower at that time.  You have steri-strips (small skin tapes) in place directly over the incision.  These strips should be left on the skin for 7-10 days.   DERMABOND/SKIN GLUE - you may shower in 24 hours.  The glue will flake off over the next 2-3 weeks. Any sutures or staples will be removed at the office during your follow-up visit.  ACTIVITIES You may resume regular (light) daily activities beginning the next day--such as daily self-care, walking, climbing stairs--gradually increasing activities as tolerated.  You may have sexual intercourse when it is comfortable.  Refrain from any heavy lifting or straining until approved by your doctor. You may drive when you are no longer taking prescription pain medication, you can comfortably wear a seatbelt, and you can safely maneuver your car and apply brakes.  FOLLOW-UP You should see your doctor in the office for a follow-up appointment approximately 2-3 weeks after your surgery.  You should have been given your post-op/follow-up appointment when your surgery was scheduled.  If you did not receive a post-op/follow-up appointment, make sure that you call for this appointment within a day or two after you arrive home to insure a convenient appointment time.  OTHER INSTRUCTIONS   WHEN TO CALL YOUR DOCTOR: Fever over 101.0 Inability to urinate Continued   bleeding from incision. Increased pain, redness, or drainage from the incision. Increasing abdominal pain  The clinic staff is available to answer your questions during regular business hours.  Please don't hesitate to call and ask to speak to  one of the nurses for clinical concerns.  If you have a medical emergency, go to the nearest emergency room or call 911.  A surgeon from [facility] is always on call at the hospital. [address], [address], [place], [place]  [address] ? [address] [address], [place], [place]   [address] [phone] ? [phone] ? FAX [phone] Web site: [email or web address]     Managing Your Pain After Surgery Without Opioids    Thank you for participating in our program to help patients manage their pain after surgery without opioids. This is part of our effort to provide you with the best care possible, without exposing you or your family to the risk that opioids pose.  What pain can I expect after surgery? You can expect to have some pain after surgery. This is normal. The pain is typically worse the day after surgery, and quickly begins to get better. Many studies have found that many patients are able to manage their pain after surgery with Over-the-Counter (OTC) medications such as Tylenol and Motrin. If you have a condition that does not allow you to take Tylenol or Motrin, notify your surgical team.  How will I manage my pain? The best strategy for controlling your pain after surgery is around the clock pain control with Tylenol (acetaminophen) and Motrin (ibuprofen or Advil). Alternating these medications with each other allows you to maximize your pain control. In addition to Tylenol and Motrin, you can use heating pads or ice packs on your incisions to help reduce your pain.  How will I alternate your regular strength over-the-counter pain medication? You will take a dose of pain medication every three hours. Start by taking 650 mg of Tylenol (2 pills of 325 mg) 3 hours later take 600 mg of Motrin (3 pills of 200 mg) 3 hours after taking the Motrin take 650 mg of Tylenol 3 hours after that take 600 mg of Motrin.   - 1 -  See example - if your first dose of  Tylenol is at 12:00 PM   12:00 PM Tylenol 650 mg (2 pills of 325 mg)  3:00 PM Motrin 600 mg (3 pills of 200 mg)  6:00 PM Tylenol 650 mg (2 pills of 325 mg)  9:00 PM Motrin 600 mg (3 pills of 200 mg)  Continue alternating every 3 hours   We recommend that you follow this schedule around-the-clock for at least 3 days after surgery, or until you feel that it is no longer needed. Use the table on the last page of this handout to keep track of the medications you are taking. Important: Do not take more than 3000mg of Tylenol or 1600mg of Motrin in a 24-hour period. Do not take ibuprofen/Motrin if you have a history of bleeding stomach ulcers, severe kidney disease, &/or actively taking a blood thinner  What if I still have pain? If you have pain that is not controlled with the over-the-counter pain medications (Tylenol and Motrin or Advil) you might have what we call "breakthrough" pain. You will receive a prescription for a small amount of an opioid pain medication such as Oxycodone, Tramadol, or Tylenol with Codeine. Use these opioid pills in the first 24 hours after surgery if you have breakthrough   pain. Do not take more than 1 pill every 4-6 hours.  If you still have uncontrolled pain after using all opioid pills, don't hesitate to call our staff using the number provided. We will help make sure you are managing your pain in the best way possible, and if necessary, we can provide a prescription for additional pain medication.   Day 1    Time  Name of Medication Number of pills taken  Amount of Acetaminophen  Pain Level   Comments  AM PM       AM PM       AM PM       AM PM       AM PM       AM PM       AM PM       AM PM       Total Daily amount of Acetaminophen Do not take more than  3,000 mg per day      Day 2    Time  Name of Medication Number of pills taken  Amount of Acetaminophen  Pain Level   Comments  AM PM       AM PM       AM PM       AM PM       AM PM        AM PM       AM PM       AM PM       Total Daily amount of Acetaminophen Do not take more than  3,000 mg per day      Day 3    Time  Name of Medication Number of pills taken  Amount of Acetaminophen  Pain Level   Comments  AM PM       AM PM       AM PM       AM PM          AM PM       AM PM       AM PM       AM PM       Total Daily amount of Acetaminophen Do not take more than  3,000 mg per day      Day 4    Time  Name of Medication Number of pills taken  Amount of Acetaminophen  Pain Level   Comments  AM PM       AM PM       AM PM       AM PM       AM PM       AM PM       AM PM       AM PM       Total Daily amount of Acetaminophen Do not take more than  3,000 mg per day      Day 5    Time  Name of Medication Number of pills taken  Amount of Acetaminophen  Pain Level   Comments  AM PM       AM PM       AM PM       AM PM       AM PM       AM PM       AM PM       AM PM       Total Daily amount of Acetaminophen Do not take more   than  3,000 mg per day       Day 6    Time  Name of Medication Number of pills taken  Amount of Acetaminophen  Pain Level  Comments  AM PM       AM PM       AM PM       AM PM       AM PM       AM PM       AM PM       AM PM       Total Daily amount of Acetaminophen Do not take more than  3,000 mg per day      Day 7    Time  Name of Medication Number of pills taken  Amount of Acetaminophen  Pain Level   Comments  AM PM       AM PM       AM PM       AM PM       AM PM       AM PM       AM PM       AM PM       Total Daily amount of Acetaminophen Do not take more than  3,000 mg per day        For additional information about how and where to safely dispose of unused opioid medications - https://www.morepowerfulnc.org  Disclaimer: This document contains information and/or instructional materials adapted from [facility] for the typical patient with your condition. It does not  replace medical advice from your health care provider because your experience may differ from that of the typical patient. Talk to your health care provider if you have any questions about this document, your condition or your treatment plan. Adapted from [facility]  

## 2022-08-30 ENCOUNTER — Encounter (HOSPITAL_COMMUNITY): Payer: Self-pay | Admitting: General Surgery

## 2022-08-30 LAB — SURGICAL PATHOLOGY

## 2023-05-09 NOTE — Progress Notes (Unsigned)
   Rubin Payor, PhD, LAT, ATC acting as a scribe for [name], MD.  Benjamin Ho is a 52 y.o. male who presents to [facility] at [facility] today for calf pain ongoing for several months. He recalls injuring it playing pickleball. Pain had improved somewhat w/ some HEP, but not doesn't seem to be helping. Pt locates pain to ***  LE swelling: Aggravates: Treatments tried:  Pertinent review of systems: ***  Relevant historical information: ***   Exam:  There were no vitals taken for this visit. General: Well Developed, well nourished, and in no acute distress.   MSK: ***    Lab and Radiology Results No results found for this or any previous visit (from the past 72 hour(s)). No results found.     Assessment and Plan: 52 y.o. male with ***   PDMP not reviewed this encounter. No orders of the defined types were placed in this encounter.  No orders of the defined types were placed in this encounter.    Discussed warning signs or symptoms. Please see discharge instructions. Patient expresses understanding.   ***

## 2023-05-10 ENCOUNTER — Ambulatory Visit: Payer: Self-pay

## 2023-05-10 ENCOUNTER — Ambulatory Visit: Payer: Managed Care, Other (non HMO) | Admitting: Family Medicine

## 2023-05-10 VITALS — BP 112/76 | HR 55 | Ht 68.5 in | Wt 166.0 lb

## 2023-05-10 DIAGNOSIS — M79662 Pain in left lower leg: Secondary | ICD-10-CM | POA: Diagnosis not present

## 2023-05-10 DIAGNOSIS — S86812A Strain of other muscle(s) and tendon(s) at lower leg level, left leg, initial encounter: Secondary | ICD-10-CM | POA: Diagnosis not present

## 2023-05-10 NOTE — Patient Instructions (Addendum)
Thank you for coming in today.   Please complete the exercises that the athletic trainer went over with you:  View at www.my-exercise-code.com using code: ZOXWR6E  I recommend you obtained a compression sleeve to help with your joint problems. There are many options on the market however I recommend obtaining a Full Calf Body Helix compression sleeve.  You can find information (including how to appropriate measure yourself for sizing) can be found at www.Body [employer].ch.  Many of these products are health savings account (HSA) eligible.  You can use the compression sleeve at any time throughout the day but is most important to use while being active as well as for 2 hours post-activity.   It is appropriate to ice following activity with the compression sleeve in place.   Check back as needed

## 2023-07-17 ENCOUNTER — Ambulatory Visit: Payer: Managed Care, Other (non HMO) | Admitting: Family Medicine

## 2023-07-17 ENCOUNTER — Encounter: Payer: Self-pay | Admitting: Family Medicine

## 2023-07-17 ENCOUNTER — Other Ambulatory Visit: Payer: Self-pay

## 2023-07-17 VITALS — BP 102/78 | HR 62 | Ht 68.5 in | Wt 167.0 lb

## 2023-07-17 DIAGNOSIS — G8929 Other chronic pain: Secondary | ICD-10-CM

## 2023-07-17 DIAGNOSIS — M25562 Pain in left knee: Secondary | ICD-10-CM

## 2023-07-17 MED ORDER — NITROGLYCERIN 0.2 MG/HR TD PT24: MEDICATED_PATCH | TRANSDERMAL | 1 refills | Status: DC

## 2023-07-17 NOTE — Progress Notes (Signed)
   I, Stevenson Clinch, CMA acting as a scribe for [name], MD.   Benjamin Ho is a 52 y.o. male who presents to [facility] at [facility] today for L knee pain. Pt was previously seen by Dr. Denyse Amass on 05/10/23 for a L calf strain.  Today, pt c/o L knee pain x 1.5 years. Sx started to flare up again this past Tuesday. Pt locates pain to peripatellar region. Has received intra-articular steroid inj in the past with no relief. It was then determined that sx were coming from the lower back Sx worse when running, pain will radiating into the lateral thigh. Notes some pain proximal to the knee when trying to run this morning. Sx seem to be worse with distance running. Having intermittent lower back pain, today feels "OK". Will occasionally have pain in the med-back. Continues with HEP provided at last visit.   L Knee swelling: no Mechanical symptoms: yes Aggravates: long distance running Treatments tried: HEP  Pertinent review of systems: No fevers or chills  Relevant historical information: Otherwise healthy   Exam:  BP 102/78   Pulse 62   Ht 5' 8.5" (1.74 m)   Wt 167 lb (75.8 kg)   SpO2 98%   BMI 25.02 kg/m  General: Well Developed, well nourished, and in no acute distress.   MSK: Left knee normal-appearing Normal motion. Tender palpation medial quad tendon. Intact strength. Stable ligamentous exam.     Lab and Radiology Results  Diagnostic Limited MSK Ultrasound of: Left knee Quad tendon intact.  Small joint effusion superior patellar space. Dynamic imaging of area of pain at medial quad shows an area of slight soft tissue impingement on the corner of the superior medial femur. Impression: Soft tissue impingement/quad tendinitis     Assessment and Plan: 52 y.o. male with chronic left knee pain.  This is acute exacerbation.  He has similar problem a year ago managed with physical therapy.  Plan to continue home exercise program and add  nitroglycerin patch protocol.  Recheck in a month if not improved consider direct injection or even MRI   PDMP not reviewed this encounter. Orders Placed This Encounter  Procedures   [place] LIMITED JOINT SPACE STRUCTURES LOW LEFT(NO LINKED CHARGES)    Reason for Exam (SYMPTOM  OR DIAGNOSIS REQUIRED):   left knee pain    Preferred imaging location?:   [facility] ordered this encounter  Medications   nitroGLYCERIN (NITRODUR - DOSED IN MG/24 HR) 0.2 mg/hr patch    Sig: Apply 1/4 patch daily to tendon for tendonitis.    Dispense:  30 patch    Refill:  1     Discussed warning signs or symptoms. Please see discharge instructions. Patient expresses understanding.   The above documentation has been reviewed and is accurate and complete [name], M.D.

## 2023-07-17 NOTE — Patient Instructions (Signed)
Thank you for coming in today.   Continue the home exercises for the knee.   For the scapula look up the home exercises for rhomboid strain.   Recheck in 1 month especially if not better.    Nitroglycerin Protocol  Apply 1/4 nitroglycerin patch to affected area daily. Change position of patch within the affected area every 24 hours. You may experience a headache during the first 1-2 weeks of using the patch, these should subside. If you experience headaches after beginning nitroglycerin patch treatment, you may take your preferred over the counter pain reliever. Another side effect of the nitroglycerin patch is skin irritation or rash related to patch adhesive. Please notify our office if you develop more severe headaches or rash, and stop the patch. Tendon healing with nitroglycerin patch may require 12 to 24 weeks depending on the extent of injury. Men should not use if taking Viagra, Cialis, or Levitra.  Do not use if you have migraines or rosacea.

## 2023-08-20 NOTE — Progress Notes (Unsigned)
   Benjamin Payor, PhD, LAT, ATC acting as a scribe for Benjamin Graham, MD.  Benjamin Ho is a 53 y.o. male who presents to [facility] at [facility] today for f/u L knee pain. Pt was last seen by Dr. Denyse Amass on 07/17/23 and was advised to cont HEP and was prescribed nitroglycerin patches.  Today, pt reports he feels like the nitroglycerin patches helped a bit. He is now feeling some LBP. He has been working w/ HEP. Pain is exacerbated by prolonged car rides. Pain is midline low back, distal L-spine/sacral region. L knee pain is located medial to the patella.   Pertinent review of systems: No fever or chills  Relevant historical information: Otherwise healthy   Exam:  BP 124/82   Pulse 62   Ht 5' 8.5" (1.74 m)   SpO2 96%   BMI 25.02 kg/m  General: Well Developed, well nourished, and in no acute distress.   MSK: Left knee normal-appearing normal motion.  Mildly tender palpation just medial to the patella at the femoral condyle. Intact strength.  L-spine nontender to palpation normal lumbar motion.     Patient mentions that he has had x-rays at [facility] orth[facility]ics for his knee and his back in the past.  I do not have access to these reports or imaging at this time.     Assessment and Plan: 53 y.o. 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  Improving with nitroglycerin patches and home exercise program.  Plan to continue home exercise program and nitroglycerin patches.  Next step would be adding formal physical therapy.  Chronic low back pain with acute exacerbation.  Doing pretty well with core strengthening exercises on his own.  Can add formal PT if needed in the future.  Recheck as needed.  Can add PT or x-rays with a phone call or MyChart message. PDMP not reviewed this encounter. No orders of the defined types were placed in this encounter.  No orders of the defined types were placed in this  encounter.    Discussed warning signs or symptoms. Please see discharge instructions. Patient expresses understanding.   The above documentation has been reviewed and is accurate and complete Benjamin Ho, M.D.

## 2023-08-21 ENCOUNTER — Ambulatory Visit: Payer: Managed Care, Other (non HMO) | Admitting: Family Medicine

## 2023-08-21 VITALS — BP 124/82 | HR 62 | Ht 68.5 in

## 2023-08-21 DIAGNOSIS — G8929 Other chronic pain: Secondary | ICD-10-CM

## 2023-08-21 DIAGNOSIS — M25562 Pain in left knee: Secondary | ICD-10-CM

## 2023-08-21 DIAGNOSIS — M545 Low back pain, unspecified: Secondary | ICD-10-CM | POA: Diagnosis not present

## 2023-08-21 NOTE — Patient Instructions (Addendum)
Thank you for coming in today. '  Continue to work on home exercises including core strength.   I can add physical therapy any time.   Continue nitroglycerin patches for typically a total of 3 months.   Return if not improving.

## 2024-05-12 ENCOUNTER — Other Ambulatory Visit: Payer: Self-pay

## 2024-05-12 ENCOUNTER — Ambulatory Visit: Admitting: Family Medicine

## 2024-05-12 ENCOUNTER — Ambulatory Visit

## 2024-05-12 VITALS — BP 122/70 | HR 68 | Ht 68.5 in

## 2024-05-12 DIAGNOSIS — M79652 Pain in left thigh: Secondary | ICD-10-CM | POA: Diagnosis not present

## 2024-05-12 DIAGNOSIS — M25552 Pain in left hip: Secondary | ICD-10-CM

## 2024-05-12 NOTE — Progress Notes (Signed)
   I, Claretha Schimke am a scribe for Dr. Artist Lloyd, MD.  Benjamin Ho is a 53 y.o. male who presents to [facility] at [facility] today for upper leg pain. Pt was last seen by Dr. Lloyd on 08/21/23 for left knee and midline lower back pain. Pt advised to continue Nitroglycerin  Protocol and HEP for quad tendon impingement.  Today, patient reports that it is feeling better today. Much better than when he hurtit on Wednesday.  Couple of months ago stepped p with left leg and had groin pain. He had a lot of urination during the day. Three weeks ago running up the steps and pulled a muscles in his left leg in the thigh area. Did some exercises that he found of youtube. Played pickleball last week and when he planted right leg and turning  his left leg did not move with it. Has two tournaments coming up and is hoping that the doctor can give him some guidance so that he can play.   Pertinent review of systems: No fevers or chills  Relevant historical information: Healthy 53 year old man.  Competitive pickleball player.   Exam:  BP 122/70   Pulse 68   Ht 5' 8.5 (1.74 m)   SpO2 98%   BMI 25.02 kg/m  General: Well Developed, well nourished, and in no acute distress.   MSK: Left hip normal-appearing Not particular tender to palpation.  Normal hip motion.  Some pain with flexion and rotation. Intact strength without much pain.    Lab and Radiology Results  X-ray images left hip obtained today personally and independently interpreted. No acute fractures.  No severe degenerative changes.  No acute abnormality visible in the pelvis. Await formal radiology review    Assessment and Plan: 53 y.o. male with left anterior hip pain.  Pain is worse with a rapid activity.  This could represent a hip flexor strain, sports hernia, labrum degeneration or tear.  Plan for trial of physical therapy.  Recommend compression.  If not improved he will let me know and plan  for recheck.  Will have to decide if he needs an MRI arthrogram or MRI sports hernia protocol.  Recheck in about 2 months.   PDMP not reviewed this encounter. Orders Placed This Encounter  Procedures   DG HIP UNILAT WITH PELVIS 2-3 VIEWS LEFT    Standing Status:   Future    Number of Occurrences:   1    Expiration Date:   05/12/2025    Reason for Exam (SYMPTOM  OR DIAGNOSIS REQUIRED):   hip pain    Preferred imaging location?:   [facility]   Ambulatory referral to Physical Therapy    Referral Priority:   Routine    Referral Type:   Physical Medicine    Referral Reason:   Specialty Services Required    Requested Specialty:   Physical Therapy    Number of Visits Requested:   1   No orders of the defined types were placed in this encounter.    Discussed warning signs or symptoms. Please see discharge instructions. Patient expresses understanding.   The above documentation has been reviewed and is accurate and complete Artist Lloyd, M.D.

## 2024-05-12 NOTE — Patient Instructions (Addendum)
 Thank you for coming in today.  Recommend a body helix sleeve.  X-ray on the way out.  A referral for physical therapy has been submitted. A representative from the physical therapy office will contact you to coordinate scheduling after confirming your benefits with your insurance provider. If you do not hear from the physical therapy office within the next 1-2 weeks, please let us  know.   Return in 8 weeks

## 2024-05-15 ENCOUNTER — Ambulatory Visit: Payer: Self-pay | Admitting: Family Medicine

## 2024-05-15 NOTE — Progress Notes (Signed)
 Left hip x-ray looks okay to radiology.

## 2024-05-22 ENCOUNTER — Ambulatory Visit: Admitting: Physical Therapy

## 2024-05-22 ENCOUNTER — Other Ambulatory Visit: Payer: Self-pay

## 2024-05-22 ENCOUNTER — Encounter: Payer: Self-pay | Admitting: Physical Therapy

## 2024-05-22 DIAGNOSIS — M25552 Pain in left hip: Secondary | ICD-10-CM

## 2024-05-22 DIAGNOSIS — M79652 Pain in left thigh: Secondary | ICD-10-CM

## 2024-05-22 DIAGNOSIS — M6281 Muscle weakness (generalized): Secondary | ICD-10-CM | POA: Diagnosis not present

## 2024-05-22 NOTE — Patient Instructions (Signed)
 Access Code: [email or web address] URL: [email or web address] Date: 05/22/2024 Prepared by: Elaine Daring  Exercises - Supine Bridge with [employer] Between Knees  - 1 x daily - 3 sets - 8 reps - 5seconds hold - Supine 90/90 with Leg Extensions  - 1 x daily - 3 sets - 10 reps - Bent Knee Fallouts  - 2 sets - 10 reps - Sidelying Hip Abduction with Resistance at Thighs  - 1 x daily - 3 sets - 10 reps

## 2024-05-22 NOTE — Therapy (Signed)
 OUTPATIENT PHYSICAL THERAPY EVALUATION   Patient Name: Benjamin Ho MRN: [id number] DOB:01-Nov-1970, 53 y.o., male Today's Date: 05/22/2024   END OF SESSION:  PT End of Session - 05/22/24 0753     Visit Number 1    Number of Visits 9    Date for Recertification  07/17/24    Authorization Type [employer]    PT Start Time 0800    PT Stop Time 0859    PT Time Calculation (min) 59 min    Activity Tolerance Patient tolerated treatment well    Behavior During Therapy [facility] for tasks assessed/performed          Past Medical History:  Diagnosis Date   Allergy    per pt   Anxiety    GERD (gastroesophageal reflux disease)    per pt   Past Surgical History:  Procedure Laterality Date   CHOLECYSTECTOMY N/A 08/29/2022   Procedure: LAPAROSCOPIC CHOLECYSTECTOMY;  Surgeon: Tanda Locus, MD;  Location: [facility];  Service: General;  Laterality: N/A;   EUS N/A 04/26/2020   Procedure: LOWER ENDOSCOPIC ULTRASOUND (EUS);  Surgeon: Wilhelmenia Aloha Raddle., MD;  Location: [facility] ENDOSCOPY;  Service: Gastroenterology;  Laterality: N/A;   FINGER SURGERY     FLEXIBLE SIGMOIDOSCOPY N/A 04/26/2020   Procedure: FLEXIBLE SIGMOIDOSCOPY;  Surgeon: Wilhelmenia Aloha Raddle., MD;  Location: [facility] ENDOSCOPY;  Service: Gastroenterology;  Laterality: N/A;   mallet finger     pinky right hand   UPPER GASTROINTESTINAL ENDOSCOPY     8-10 yrs ago per pt.   WISDOM TOOTH EXTRACTION Bilateral 2022   Patient Active Problem List   Diagnosis Date Noted   Strain of left calf muscle 05/10/2023    PCP: Valentin Skates, DO  REFERRING PROVIDER: Joane Artist RAMAN, MD  REFERRING DIAG: Left thigh pain; Left hip pain  THERAPY DIAG:  Pain in left hip  Pain in left thigh  Muscle weakness (generalized)  Rationale for Evaluation and Treatment: Rehabilitation  ONSET DATE: Chronic, worsened 3 weeks ago from eval   SUBJECTIVE:  SUBJECTIVE STATEMENT: Patient reports about the beginning of the summer he was doing  something at home and he had to take a big step up and felt some pain in the front of the hip. He states he was not having that sensation all the time but then he was going up the stairs and his foot got stuck and he felt like he pulled a muscle in the left thigh so he did some exercises from youtube and it felt better. Then about 3 weeks ago he went to hit a ball on the right and his left leg stayed planted as he rotated away from the left leg and he felt like he pulled something in the inner thigh to the front of the hip. He stopped playing pickleball for about a week, then tried to go back and play but was not able to play due to pain in the front of the left hip, lower abdominals, and inner left thigh. He also noticed some swelling in the lower abdomen as well. So for the past week he has just rested and used ice which has improved his pain. He reports pain mainly occurs when he tries to play pickleball and sometimes when he is walking around, if his foot gets stuck then he will get the pain. He also notices the pain when he goes to roll in bed and the left leg moves first or opens up, and when getting out of the car when  he has to lift and move his leg out. He states it has become more discomfort than pain. The pain tends to be sharp and quick, and then will go away once he repositions the leg. He did notice one instance while riding in a car for hours where he was having some pain in the right leg and on the outside of the right foot. He was working out everyday prior to the injury, where he was playing pickleball and doing core exercises.  PERTINENT HISTORY: See PMH above  PAIN:  Are you having pain? Yes:  NPRS scale: 0/10 currently, 5/10 at worst Pain location: Left anterior hip, lower abdominal, inner thigh Pain description: Sharp Aggravating factors: Movement of the left hip, pickleball Relieving factors: Reposition the left leg out of painful position  PRECAUTIONS: None  RED  FLAGS: None   WEIGHT BEARING RESTRICTIONS: No  FALLS:  Has patient fallen in last 6 months? No  PLOF: Independent  PATIENT GOALS: Get back to exercise and pickleball   OBJECTIVE:  Note: Objective measures were completed at Evaluation unless otherwise noted. PATIENT SURVEYS:  LEFS: 50/80  COGNITION: Overall cognitive status: Within functional limits for tasks assessed     SENSATION: WFL  MUSCLE LENGTH: Slight limitation with left hip flexor and adductor length  POSTURE:   Grossly WFL  PALPATION: Patient reports tenderness to palpation of left proximal adductor region, hip flexor just medial to ASIS, TFL, deep palpation to hip flexor in lower abdominal region  LOWER EXTREMITY ROM:   Hip PROM grossly WFL and non painful  LOWER EXTREMITY MMT:  MMT Right eval Left eval  Hip flexion 4+ 4  Hip extension 4 4  Hip abduction 4+ 4  Hip adduction    Hip internal rotation    Hip external rotation    Knee flexion 5 5  Knee extension 5 5  Ankle dorsiflexion    Ankle plantarflexion    Ankle inversion    Ankle eversion     (Blank rows = not tested)  FUNCTIONAL TESTS:  Squat: grossly WFL, no pain reported SL squat: patient exhibits decreased control and difficulty bilaterally, no pain reported DLLT: 30 deg without pain  GAIT: Assistive device utilized: None Level of assistance: Complete Independence Comments: grossly WFL                                                                                                                               TREATMENT  [facility] Adult PT Treatment:                                                DATE: 05/22/2024 Bridge with adductor ball squeeze 90-90 alternating leg extensions Hooklying bent knee fall out Side hip abduction with red at knees  Spent time discussing patient's previous exercise routine and pickleball  schedule, he can continue with upper body exercises and gradually progress LE strengthening as he feels comfortable,  explained that he should start with light weights or easier movements and gradually progress as he is able, discussed pain associated with exercise and when to regress based on stop light analogy for the 0-10 pain scale. Discussed a stretch patient is performing using calf stretching device with forward bend for hamstring and lower back. Discussed patient using a thigh compression sleeve and expected progression and time frame for return to pickleball activities.  PATIENT EDUCATION:  Education details: Exam findings, POC, HEP Person educated: Patient Education method: Explanation, Demonstration, Tactile cues, Verbal cues, and Handouts Education comprehension: verbalized understanding, returned demonstration, verbal cues required, tactile cues required, and needs further education  HOME EXERCISE PROGRAM: Access Code: [email or web address]    ASSESSMENT: CLINICAL IMPRESSION: Patient is a 53 y.o. male who was seen today for physical therapy evaluation and treatment for acute on chronic left hip, groin, and lower abdominal pain. His current symptoms seem to be consistent with left adductor and hip flexor related pain. He did not report any intraarticular hip related symptoms this visit. He does exhibit strength and flexibility deficits of the left hip that is likely contributing to his pain and impacting his functional ability to participate in exercise and pickleball.   OBJECTIVE IMPAIRMENTS: decreased activity tolerance, decreased strength, impaired flexibility, and pain.   ACTIVITY LIMITATIONS: lifting, squatting, sleeping, and locomotion level  PARTICIPATION LIMITATIONS: community activity  PERSONAL FACTORS: Time since onset of injury/illness/exacerbation are also affecting patient's functional outcome.   REHAB POTENTIAL: Good  CLINICAL DECISION MAKING: Stable/uncomplicated  EVALUATION COMPLEXITY: Low   GOALS: Goals reviewed with patient? Yes  SHORT TERM GOALS: Target date: 06/19/2024  Patient  will be I with initial HEP in order to progress with therapy. Baseline: HEP provided at eval Goal status: INITIAL  2.  Patient will report left hip/thigh pain with activity and exercise </= 2/10 in order to reduce functional limitations Baseline: 5/10 Goal status: INITIAL  LONG TERM GOALS: Target date: 07/17/2024  Patient will be I with final HEP to maintain progress from PT. Baseline: HEP provided at eval Goal status: INITIAL  2.  Patient will report LEFS >/= 72/80/80 in order to indicate improvement in functional status. Baseline: 50/80 Goal status: INITIAL  3.  Patient will demonstrate left hip strength >/= 4+/5 MMT in order to improve activity tolerance and confidence with activities such as pickleball Baseline: see limitations above Goal status: INITIAL  4.  Patient will report return to activites such as pickleball and exercise with increased pain or limitation to indicate return to prior level of function Baseline: unable to participate in pickleball Goal status: INITIAL   PLAN: PT FREQUENCY: 1x/week  PT DURATION: 8 weeks  PLANNED INTERVENTIONS: 97164- PT Re-evaluation, 97750- Physical Performance Testing, 97110-Therapeutic exercises, 97530- Therapeutic activity, 97112- Neuromuscular re-education, 97535- Self Care, 02859- Manual therapy, 20560 (1-2 muscles), 20561 (3+ muscles)- Dry Needling, Patient/Family education, Balance training, Joint mobilization, Joint manipulation, Spinal manipulation, Spinal mobilization, Cryotherapy, and Moist heat  PLAN FOR NEXT SESSION: Review HEP and progress PRN, manual/TPDN for the left hip and thigh region, initiate flexibility for the left hip and thigh, progress hip and core strengthening as tolerated, initiate more dynamic functional movements to improve tolerance for activity   Elaine Daring, PT, DPT, LAT, ATC 05/22/24  12:57 PM Phone: [phone] Fax: [phone]

## 2024-06-03 ENCOUNTER — Ambulatory Visit: Admitting: Physical Therapy

## 2024-06-03 ENCOUNTER — Encounter: Payer: Self-pay | Admitting: Physical Therapy

## 2024-06-03 ENCOUNTER — Other Ambulatory Visit: Payer: Self-pay

## 2024-06-03 DIAGNOSIS — M79652 Pain in left thigh: Secondary | ICD-10-CM

## 2024-06-03 DIAGNOSIS — M25552 Pain in left hip: Secondary | ICD-10-CM | POA: Diagnosis not present

## 2024-06-03 DIAGNOSIS — M6281 Muscle weakness (generalized): Secondary | ICD-10-CM

## 2024-06-03 NOTE — Therapy (Signed)
 OUTPATIENT PHYSICAL THERAPY TREATMENT   Patient Name: Benjamin Ho MRN: [id number] DOB:[birth year], 53 y.o., male Today's Date: 06/03/2024   END OF SESSION:  PT End of Session - 06/03/24 0855     Visit Number 2    Number of Visits 9    Date for Recertification  07/17/24    Authorization Type [employer]    PT Start Time [phone]    PT Stop Time 0942    PT Time Calculation (min) 55 min    Activity Tolerance Patient tolerated treatment well    Behavior During Therapy [facility] for tasks assessed/performed           Past Medical History:  Diagnosis Date   Allergy    per pt   Anxiety    GERD (gastroesophageal reflux disease)    per pt   Past Surgical History:  Procedure Laterality Date   CHOLECYSTECTOMY N/A 08/29/2022   Procedure: LAPAROSCOPIC CHOLECYSTECTOMY;  Surgeon: Tanda Locus, MD;  Location: [facility];  Service: General;  Laterality: N/A;   EUS N/A 04/26/2020   Procedure: LOWER ENDOSCOPIC ULTRASOUND (EUS);  Surgeon: Wilhelmenia Aloha Raddle., MD;  Location: [facility] ENDOSCOPY;  Service: Gastroenterology;  Laterality: N/A;   FINGER SURGERY     FLEXIBLE SIGMOIDOSCOPY N/A 04/26/2020   Procedure: FLEXIBLE SIGMOIDOSCOPY;  Surgeon: Wilhelmenia Aloha Raddle., MD;  Location: [facility] ENDOSCOPY;  Service: Gastroenterology;  Laterality: N/A;   mallet finger     pinky right hand   UPPER GASTROINTESTINAL ENDOSCOPY     8-10 yrs ago per pt.   WISDOM TOOTH EXTRACTION Bilateral 2022   Patient Active Problem List   Diagnosis Date Noted   Strain of left calf muscle 05/10/2023    PCP: Valentin Skates, DO  REFERRING PROVIDER: Joane Artist RAMAN, MD  REFERRING DIAG: Left thigh pain; Left hip pain  THERAPY DIAG:  Pain in left hip  Pain in left thigh  Muscle weakness (generalized)  Rationale for Evaluation and Treatment: Rehabilitation  ONSET DATE: Chronic, worsened 3 weeks ago from eval   SUBJECTIVE:  SUBJECTIVE STATEMENT: Patient reports he is feeling much better. Denies any pain  today. He is doing the exercises every day. He has done some other exercises like squats without any issues. Two things that are bothering him is he has not tried to run, but he was trying to jog across the street and did feel something when he did that. He does report some tightness in the mid/lower glute region.  Eval: Patient reports about the beginning of the summer he was doing something at home and he had to take a big step up and felt some pain in the front of the hip. He states he was not having that sensation all the time but then he was going up the stairs and his foot got stuck and he felt like he pulled a muscle in the left thigh so he did some exercises from youtube and it felt better. Then about 3 weeks ago he went to hit a ball on the right and his left leg stayed planted as he rotated away from the left leg and he felt like he pulled something in the inner thigh to the front of the hip. He stopped playing pickleball for about a week, then tried to go back and play but was not able to play due to pain in the front of the left hip, lower abdominals, and inner left thigh. He also noticed some swelling in the lower abdomen as well. So for the  past week he has just rested and used ice which has improved his pain. He reports pain mainly occurs when he tries to play pickleball and sometimes when he is walking around, if his foot gets stuck then he will get the pain. He also notices the pain when he goes to roll in bed and the left leg moves first or opens up, and when getting out of the car when he has to lift and move his leg out. He states it has become more discomfort than pain. The pain tends to be sharp and quick, and then will go away once he repositions the leg. He did notice one instance while riding in a car for hours where he was having some pain in the right leg and on the outside of the right foot. He was working out everyday prior to the injury, where he was playing pickleball and doing core  exercises.  PERTINENT HISTORY: See PMH above  PAIN:  Are you having pain? Yes:  NPRS scale: 0/10 currently, 5/10 at worst Pain location: Left anterior hip, lower abdominal, inner thigh Pain description: Sharp Aggravating factors: Movement of the left hip, pickleball Relieving factors: Reposition the left leg out of painful position  PRECAUTIONS: None  PATIENT GOALS: Get back to exercise and pickleball   OBJECTIVE:  Note: Objective measures were completed at Evaluation unless otherwise noted. PATIENT SURVEYS:  LEFS: 50/80  MUSCLE LENGTH: Slight limitation with left hip flexor and adductor length  PALPATION: Patient reports tenderness to palpation of left proximal adductor region, hip flexor just medial to ASIS, TFL, deep palpation to hip flexor in lower abdominal region  LOWER EXTREMITY ROM:   Hip PROM grossly WFL and non painful  LOWER EXTREMITY MMT:  MMT Right eval Left eval  Hip flexion 4+ 4  Hip extension 4 4  Hip abduction 4+ 4  Hip adduction    Hip internal rotation    Hip external rotation    Knee flexion 5 5  Knee extension 5 5  Ankle dorsiflexion    Ankle plantarflexion    Ankle inversion    Ankle eversion     (Blank rows = not tested)  FUNCTIONAL TESTS:  Squat: grossly WFL, no pain reported SL squat: patient exhibits decreased control and difficulty bilaterally, no pain reported DLLT: 30 deg without pain  GAIT: Assistive device utilized: None Level of assistance: Complete Independence Comments: grossly WFL                                                                                                                               TREATMENT  [facility] Adult PT Treatment:                                                DATE: 06/03/2024 Recumbent bike L5 x 3 min  to improve endurance and workload capacity Piriformis stretch push/pull 2 x 20 sec each Bridge with alternating knee extension 2 x 5 x 3 sec each Dead bug x 10 each, with stability ball x 10  each Straight leg bridge with alternating hip flexion x 5 each, with yellow loop at feet 2 x 5 each Lateral band walk with blue at mid shin 2 x 20 down/back Forward, backward, lateral lunge x 10 each  Spent time discussing return to workouts he was previously performing and extensive discussion on return to pickleball and running or shuffling movements  PATIENT EDUCATION:  Education details: HEP Person educated: Patient Education method: Programmer, Multimedia, Demonstration, Tactile cues, Verbal cues Education comprehension: verbalized understanding, returned demonstration, verbal cues required, tactile cues required, and needs further education  HOME EXERCISE PROGRAM: Access Code: [email or web address]    ASSESSMENT: CLINICAL IMPRESSION: Patient tolerated therapy well with no adverse effects. Therapy focused primarily on progressing his core and hip strengthening with good tolerance. He was able to progress with difficulty of exercise level and perform more standing exercises such as lunges with good tolerance. He did report so discomfort with reverse lunges but when cued for a short step this resolved the pain to reduce amount of tension placed on the abdominal/hip flexor/adductor region. Extensive discussion on return to exercise and pickleball by starting with easier movements and gradually progressing as tolerated. No specific changes made to his HEP but encouraged patient to reintroduce more LE strengthening and dynamic movements into his prior exercise routine. Patient would benefit from continued skilled PT to progress mobility and strength in order to reduce pain and maximize functional ability.   Eval: Patient is a 53 y.o. male who was seen today for physical therapy evaluation and treatment for acute on chronic left hip, groin, and lower abdominal pain. His current symptoms seem to be consistent with left adductor and hip flexor related pain. He did not report any intraarticular hip related symptoms this  visit. He does exhibit strength and flexibility deficits of the left hip that is likely contributing to his pain and impacting his functional ability to participate in exercise and pickleball.   OBJECTIVE IMPAIRMENTS: decreased activity tolerance, decreased strength, impaired flexibility, and pain.   ACTIVITY LIMITATIONS: lifting, squatting, sleeping, and locomotion level  PARTICIPATION LIMITATIONS: community activity  PERSONAL FACTORS: Time since onset of injury/illness/exacerbation are also affecting patient's functional outcome.    GOALS: Goals reviewed with patient? Yes  SHORT TERM GOALS: Target date: 06/19/2024  Patient will be I with initial HEP in order to progress with therapy. Baseline: HEP provided at eval Goal status: INITIAL  2.  Patient will report left hip/thigh pain with activity and exercise </= 2/10 in order to reduce functional limitations Baseline: 5/10 Goal status: INITIAL  LONG TERM GOALS: Target date: 07/17/2024  Patient will be I with final HEP to maintain progress from PT. Baseline: HEP provided at eval Goal status: INITIAL  2.  Patient will report LEFS >/= 72/80/80 in order to indicate improvement in functional status. Baseline: 50/80 Goal status: INITIAL  3.  Patient will demonstrate left hip strength >/= 4+/5 MMT in order to improve activity tolerance and confidence with activities such as pickleball Baseline: see limitations above Goal status: INITIAL  4.  Patient will report return to activites such as pickleball and exercise with increased pain or limitation to indicate return to prior level of function Baseline: unable to participate in pickleball Goal status: INITIAL   PLAN: PT FREQUENCY: 1x/week  PT DURATION: 8  weeks  PLANNED INTERVENTIONS: 97164- PT Re-evaluation, 97750- Physical Performance Testing, 97110-Therapeutic exercises, 97530- Therapeutic activity, 97112- Neuromuscular re-education, [phone]- Self Care, 02859- Manual therapy,  20560 (1-2 muscles), 20561 (3+ muscles)- Dry Needling, Patient/Family education, Balance training, Joint mobilization, Joint manipulation, Spinal manipulation, Spinal mobilization, Cryotherapy, and Moist heat  PLAN FOR NEXT SESSION: Review HEP and progress PRN, manual/TPDN for the left hip and thigh region, initiate flexibility for the left hip and thigh, progress hip and core strengthening as tolerated, initiate more dynamic functional movements to improve tolerance for activity   Elaine Daring, PT, DPT, LAT, ATC 06/03/24  11:34 AM Phone: [phone] Fax: [phone]

## 2024-06-10 ENCOUNTER — Ambulatory Visit: Admitting: Physical Therapy

## 2024-06-10 ENCOUNTER — Encounter: Payer: Self-pay | Admitting: Physical Therapy

## 2024-06-10 ENCOUNTER — Other Ambulatory Visit: Payer: Self-pay

## 2024-06-10 DIAGNOSIS — M6281 Muscle weakness (generalized): Secondary | ICD-10-CM | POA: Diagnosis not present

## 2024-06-10 DIAGNOSIS — M25552 Pain in left hip: Secondary | ICD-10-CM

## 2024-06-10 DIAGNOSIS — M79652 Pain in left thigh: Secondary | ICD-10-CM

## 2024-06-10 NOTE — Therapy (Signed)
 OUTPATIENT PHYSICAL THERAPY TREATMENT   Patient Name: Benjamin Ho MRN: [id number] DOB:1971/05/13, 52 y.o., male Today's Date: 06/10/2024   END OF SESSION:  PT End of Session - 06/10/24 0758     Visit Number 3    Number of Visits 9    Date for Recertification  07/17/24    Authorization Type [employer]    PT Start Time [phone]    PT Stop Time 0845    PT Time Calculation (min) 49 min    Activity Tolerance Patient tolerated treatment well    Behavior During Therapy [facility] for tasks assessed/performed            Past Medical History:  Diagnosis Date   Allergy    per pt   Anxiety    GERD (gastroesophageal reflux disease)    per pt   Past Surgical History:  Procedure Laterality Date   CHOLECYSTECTOMY N/A 08/29/2022   Procedure: LAPAROSCOPIC CHOLECYSTECTOMY;  Surgeon: Tanda Locus, MD;  Location: [facility];  Service: General;  Laterality: N/A;   EUS N/A 04/26/2020   Procedure: LOWER ENDOSCOPIC ULTRASOUND (EUS);  Surgeon: Wilhelmenia Aloha Raddle., MD;  Location: [facility] ENDOSCOPY;  Service: Gastroenterology;  Laterality: N/A;   FINGER SURGERY     FLEXIBLE SIGMOIDOSCOPY N/A 04/26/2020   Procedure: FLEXIBLE SIGMOIDOSCOPY;  Surgeon: Wilhelmenia Aloha Raddle., MD;  Location: [facility] ENDOSCOPY;  Service: Gastroenterology;  Laterality: N/A;   mallet finger     pinky right hand   UPPER GASTROINTESTINAL ENDOSCOPY     8-10 yrs ago per pt.   WISDOM TOOTH EXTRACTION Bilateral 2022   Patient Active Problem List   Diagnosis Date Noted   Strain of left calf muscle 05/10/2023    PCP: Valentin Skates, DO  REFERRING PROVIDER: Joane Artist RAMAN, MD  REFERRING DIAG: Left thigh pain; Left hip pain  THERAPY DIAG:  Pain in left hip  Pain in left thigh  Muscle weakness (generalized)  Rationale for Evaluation and Treatment: Rehabilitation  ONSET DATE: Chronic, worsened 3 weeks ago from eval   SUBJECTIVE:  SUBJECTIVE STATEMENT: Patient reports things are better, the only thing that is  giving him issues is muscle in his left gluteus region. It feels really tight and when it is tight, then he feels it everywhere else, even in the front of the hip. He does feel like the initial injury is getting much better. He did do some jogging on the treadmill and states that initially he felt some tension and then it starts to feel better.   Eval: Patient reports about the beginning of the summer he was doing something at home and he had to take a big step up and felt some pain in the front of the hip. He states he was not having that sensation all the time but then he was going up the stairs and his foot got stuck and he felt like he pulled a muscle in the left thigh so he did some exercises from youtube and it felt better. Then about 3 weeks ago he went to hit a ball on the right and his left leg stayed planted as he rotated away from the left leg and he felt like he pulled something in the inner thigh to the front of the hip. He stopped playing pickleball for about a week, then tried to go back and play but was not able to play due to pain in the front of the left hip, lower abdominals, and inner left thigh. He also noticed some swelling  in the lower abdomen as well. So for the past week he has just rested and used ice which has improved his pain. He reports pain mainly occurs when he tries to play pickleball and sometimes when he is walking around, if his foot gets stuck then he will get the pain. He also notices the pain when he goes to roll in bed and the left leg moves first or opens up, and when getting out of the car when he has to lift and move his leg out. He states it has become more discomfort than pain. The pain tends to be sharp and quick, and then will go away once he repositions the leg. He did notice one instance while riding in a car for hours where he was having some pain in the right leg and on the outside of the right foot. He was working out everyday prior to the injury, where he was  playing pickleball and doing core exercises.  PERTINENT HISTORY: See PMH above  PAIN:  Are you having pain? Yes:  NPRS scale: 0/10 currently, 5/10 at worst Pain location: Left anterior hip, lower abdominal, inner thigh Pain description: Sharp Aggravating factors: Movement of the left hip, pickleball Relieving factors: Reposition the left leg out of painful position  PRECAUTIONS: None  PATIENT GOALS: Get back to exercise and pickleball   OBJECTIVE:  Note: Objective measures were completed at Evaluation unless otherwise noted. PATIENT SURVEYS:  LEFS: 50/80  MUSCLE LENGTH: Slight limitation with left hip flexor and adductor length  PALPATION: Patient reports tenderness to palpation of left proximal adductor region, hip flexor just medial to ASIS, TFL, deep palpation to hip flexor in lower abdominal region  LOWER EXTREMITY ROM:   Hip PROM grossly WFL and non painful  LOWER EXTREMITY MMT:  MMT Right eval Left eval Left 06/10/2024  Hip flexion 4+ 4 4+  Hip extension 4 4 4   Hip abduction 4+ 4 4  Hip adduction     Hip internal rotation     Hip external rotation     Knee flexion 5 5   Knee extension 5 5   Ankle dorsiflexion     Ankle plantarflexion     Ankle inversion     Ankle eversion      (Blank rows = not tested)  FUNCTIONAL TESTS:  Squat: grossly WFL, no pain reported SL squat: patient exhibits decreased control and difficulty bilaterally, no pain reported DLLT: 30 deg without pain  GAIT: Assistive device utilized: None Level of assistance: Complete Independence Comments: grossly WFL                                                                                                                               TREATMENT  [facility] Adult PT Treatment:  DATE: 06/10/2024 Supine piriformis stretch Standing pigeon stretch Pigeon stretch on mat 90-90 hip ER/IR stretch Demonstrated foam rolling for the left  piriformis/gluteal region Deadlift with 30# x 10 KB swing with 20# 2 x 14 Front plank with alternating leg lift Tall plank mountain climbers Modified pike knees to chest on stability ball Dynamic movement consisting of side shuffle, karaoke, forward skip  PATIENT EDUCATION:  Education details: HEP update Person educated: Patient Education method: Explanation, Demonstration, Tactile cues, Verbal cues, handouts Education comprehension: verbalized understanding, returned demonstration, verbal cues required, tactile cues required, and needs further education  HOME EXERCISE PROGRAM: Access Code: [email or web address]    ASSESSMENT: CLINICAL IMPRESSION: Patient tolerated therapy well with no adverse effects. Patient continues to report left gluteal/piriformis tightness so incorporated more stretching with patient reporting improvement in tightness. Therapy progressed core control and strengthening exercises, and also progressed with lifting and more dynamic movements with good tolerance. He does report occasionally feeling something in the left anterior hip/abdominal region but denies this as pain, and it typically improves as he continues with the exercise. Updated his HEP to incorporate piriformis stretching and progress core stabilization and strengthening exercises. Patient would benefit from continued skilled PT to progress mobility and strength in order to reduce pain and maximize functional ability.   Eval: Patient is a 53 y.o. male who was seen today for physical therapy evaluation and treatment for acute on chronic left hip, groin, and lower abdominal pain. His current symptoms seem to be consistent with left adductor and hip flexor related pain. He did not report any intraarticular hip related symptoms this visit. He does exhibit strength and flexibility deficits of the left hip that is likely contributing to his pain and impacting his functional ability to participate in exercise and pickleball.    OBJECTIVE IMPAIRMENTS: decreased activity tolerance, decreased strength, impaired flexibility, and pain.   ACTIVITY LIMITATIONS: lifting, squatting, sleeping, and locomotion level  PARTICIPATION LIMITATIONS: community activity  PERSONAL FACTORS: Time since onset of injury/illness/exacerbation are also affecting patient's functional outcome.    GOALS: Goals reviewed with patient? Yes  SHORT TERM GOALS: Target date: 06/19/2024  Patient will be I with initial HEP in order to progress with therapy. Baseline: HEP provided at eval Goal status: INITIAL  2.  Patient will report left hip/thigh pain with activity and exercise </= 2/10 in order to reduce functional limitations Baseline: 5/10 Goal status: INITIAL  LONG TERM GOALS: Target date: 07/17/2024  Patient will be I with final HEP to maintain progress from PT. Baseline: HEP provided at eval Goal status: INITIAL  2.  Patient will report LEFS >/= 72/80/80 in order to indicate improvement in functional status. Baseline: 50/80 Goal status: INITIAL  3.  Patient will demonstrate left hip strength >/= 4+/5 MMT in order to improve activity tolerance and confidence with activities such as pickleball Baseline: see limitations above Goal status: INITIAL  4.  Patient will report return to activites such as pickleball and exercise with increased pain or limitation to indicate return to prior level of function Baseline: unable to participate in pickleball Goal status: INITIAL   PLAN: PT FREQUENCY: 1x/week  PT DURATION: 8 weeks  PLANNED INTERVENTIONS: 97164- PT Re-evaluation, 97750- Physical Performance Testing, 97110-Therapeutic exercises, 97530- Therapeutic activity, 97112- Neuromuscular re-education, 97535- Self Care, 02859- Manual therapy, 20560 (1-2 muscles), 20561 (3+ muscles)- Dry Needling, Patient/Family education, Balance training, Joint mobilization, Joint manipulation, Spinal manipulation, Spinal mobilization, Cryotherapy,  and Moist heat  PLAN FOR NEXT SESSION: Review HEP and progress PRN, manual/TPDN  for the left hip and thigh region, initiate flexibility for the left hip and thigh, progress hip and core strengthening as tolerated, initiate more dynamic functional movements to improve tolerance for activity   Elaine Daring, PT, DPT, LAT, ATC 06/10/24  4:36 PM Phone: [phone] Fax: [phone]

## 2024-06-10 NOTE — Patient Instructions (Signed)
 Access Code: [email or web address] URL: [email or web address] Date: 06/10/2024 Prepared by: Elaine Daring  Exercises - Supine Bridge with [employer] Between Knees  - 1 x daily - 3 sets - 8 reps - 5seconds hold - Supine 90/90 with Leg Extensions  - 1 x daily - 3 sets - 10 reps - Bent Knee Fallouts  - 2 sets - 10 reps - Sidelying Hip Abduction with Resistance at Thighs  - 1 x daily - 3 sets - 10 reps - Supine Piriformis Stretch with Foot on Ground  - 1 x daily - 3 sets - 20-30 seconds hold - Pigeon Pose  - 1 x daily - 3 sets - 20-30seconds hold - Plank with Hip Extension  - 1 x daily - 2 sets - 20 reps - [employer] Fast  - 1 x daily - 2 sets - 20 reps - Mini Pike on [employer]  - 1 x daily - 2 sets - 10 reps

## 2024-06-17 ENCOUNTER — Encounter: Payer: Self-pay | Admitting: Physical Therapy

## 2024-06-17 ENCOUNTER — Other Ambulatory Visit: Payer: Self-pay

## 2024-06-17 ENCOUNTER — Ambulatory Visit: Admitting: Physical Therapy

## 2024-06-17 DIAGNOSIS — M79652 Pain in left thigh: Secondary | ICD-10-CM | POA: Diagnosis not present

## 2024-06-17 DIAGNOSIS — M25552 Pain in left hip: Secondary | ICD-10-CM

## 2024-06-17 DIAGNOSIS — M6281 Muscle weakness (generalized): Secondary | ICD-10-CM | POA: Diagnosis not present

## 2024-06-17 NOTE — Therapy (Signed)
 OUTPATIENT PHYSICAL THERAPY TREATMENT   Patient Name: Benjamin Ho MRN: [id number] DOB:11/24/1970, 53 y.o., male Today's Date: 06/17/2024   END OF SESSION:  PT End of Session - 06/17/24 0802     Visit Number 4    Number of Visits 9    Date for Recertification  07/17/24    Authorization Type [employer]    PT Start Time 0756    PT Stop Time 0850    PT Time Calculation (min) 54 min    Activity Tolerance Patient tolerated treatment well    Behavior During Therapy [facility] for tasks assessed/performed             Past Medical History:  Diagnosis Date   Allergy    per pt   Anxiety    GERD (gastroesophageal reflux disease)    per pt   Past Surgical History:  Procedure Laterality Date   CHOLECYSTECTOMY N/A 08/29/2022   Procedure: LAPAROSCOPIC CHOLECYSTECTOMY;  Surgeon: Tanda Locus, MD;  Location: [facility];  Service: General;  Laterality: N/A;   EUS N/A 04/26/2020   Procedure: LOWER ENDOSCOPIC ULTRASOUND (EUS);  Surgeon: Wilhelmenia Aloha Raddle., MD;  Location: [facility] ENDOSCOPY;  Service: Gastroenterology;  Laterality: N/A;   FINGER SURGERY     FLEXIBLE SIGMOIDOSCOPY N/A 04/26/2020   Procedure: FLEXIBLE SIGMOIDOSCOPY;  Surgeon: Wilhelmenia Aloha Raddle., MD;  Location: [facility] ENDOSCOPY;  Service: Gastroenterology;  Laterality: N/A;   mallet finger     pinky right hand   UPPER GASTROINTESTINAL ENDOSCOPY     8-10 yrs ago per pt.   WISDOM TOOTH EXTRACTION Bilateral 2022   Patient Active Problem List   Diagnosis Date Noted   Strain of left calf muscle 05/10/2023    PCP: Valentin Skates, DO  REFERRING PROVIDER: Joane Artist RAMAN, MD  REFERRING DIAG: Left thigh pain; Left hip pain  THERAPY DIAG:  Pain in left hip  Pain in left thigh  Muscle weakness (generalized)  Rationale for Evaluation and Treatment: Rehabilitation  ONSET DATE: Chronic, worsened 3 weeks ago from eval   SUBJECTIVE:  SUBJECTIVE STATEMENT: Patient reports he gets up in the morning and he feels  really tight. He has been doing the stretches which seem to help. He did start to work out more with his legs and core at home. He was able to get out an play a little pickleball without any issues, but he didn't do any quick or large movements.   Eval: Patient reports about the beginning of the summer he was doing something at home and he had to take a big step up and felt some pain in the front of the hip. He states he was not having that sensation all the time but then he was going up the stairs and his foot got stuck and he felt like he pulled a muscle in the left thigh so he did some exercises from youtube and it felt better. Then about 3 weeks ago he went to hit a ball on the right and his left leg stayed planted as he rotated away from the left leg and he felt like he pulled something in the inner thigh to the front of the hip. He stopped playing pickleball for about a week, then tried to go back and play but was not able to play due to pain in the front of the left hip, lower abdominals, and inner left thigh. He also noticed some swelling in the lower abdomen as well. So for the past week he has just rested  and used ice which has improved his pain. He reports pain mainly occurs when he tries to play pickleball and sometimes when he is walking around, if his foot gets stuck then he will get the pain. He also notices the pain when he goes to roll in bed and the left leg moves first or opens up, and when getting out of the car when he has to lift and move his leg out. He states it has become more discomfort than pain. The pain tends to be sharp and quick, and then will go away once he repositions the leg. He did notice one instance while riding in a car for hours where he was having some pain in the right leg and on the outside of the right foot. He was working out everyday prior to the injury, where he was playing pickleball and doing core exercises.  PERTINENT HISTORY: See PMH above  PAIN:  Are you  having pain? Yes:  NPRS scale: 0/10 currently, 2/10 at worst Pain location: Left anterior hip, lower abdominal, inner thigh Pain description: Sharp Aggravating factors: Movement of the left hip, pickleball Relieving factors: Reposition the left leg out of painful position  PRECAUTIONS: None  PATIENT GOALS: Get back to exercise and pickleball   OBJECTIVE:  Note: Objective measures were completed at Evaluation unless otherwise noted. PATIENT SURVEYS:  LEFS: 50/80  MUSCLE LENGTH: Slight limitation with left hip flexor and adductor length  PALPATION: Patient reports tenderness to palpation of left proximal adductor region, hip flexor just medial to ASIS, TFL, deep palpation to hip flexor in lower abdominal region  LOWER EXTREMITY ROM:   Hip PROM grossly WFL and non painful  LOWER EXTREMITY MMT:  MMT Right eval Left eval Left 06/10/2024  Hip flexion 4+ 4 4+  Hip extension 4 4 4   Hip abduction 4+ 4 4  Hip adduction     Hip internal rotation     Hip external rotation     Knee flexion 5 5   Knee extension 5 5   Ankle dorsiflexion     Ankle plantarflexion     Ankle inversion     Ankle eversion      (Blank rows = not tested)  FUNCTIONAL TESTS:  Squat: grossly WFL, no pain reported SL squat: patient exhibits decreased control and difficulty bilaterally, no pain reported DLLT: 30 deg without pain  GAIT: Assistive device utilized: None Level of assistance: Complete Independence Comments: grossly WFL                                                                                                                               TREATMENT  [facility] Adult PT Treatment:  DATE: 06/17/2024 [place] plank with knee bent on low table 3 x 20 sec each [place] plank with leg straight foot in chair 3 x 20 sec each Dead bug with stability ball 2 x 20 Kneeling stability ball roll out 2 x 10 Side plank with hip abduction 2 x 10  each Standing hip adduction with L1 power band 2 x 15 each  Spent time discussing gradual progression back to pickleball  PATIENT EDUCATION:  Education details: HEP update Person educated: Patient Education method: Explanation, Demonstration, Tactile cues, Verbal cues, Handouts Education comprehension: verbalized understanding, returned demonstration, verbal cues required, tactile cues required, and needs further education  HOME EXERCISE PROGRAM: Access Code: [email or web address]    ASSESSMENT: CLINICAL IMPRESSION: Patient tolerated therapy well with no adverse effects. Therapy focused on progressing hip strength and core stabilization with good tolerance. Incorporated more adductor strengthening with patient reporting greater challenge on the left. He is progressing well with his exercises without report of increased pain in therapy. Spent time discussing pickleball movements and gradual progression of his pickleball activity. Updated his HEP to progress core stabilization exercises and adductor strengthening for home. Patient would benefit from continued skilled PT to progress mobility and strength in order to reduce pain and maximize functional ability.   Eval: Patient is a 53 y.o. male who was seen today for physical therapy evaluation and treatment for acute on chronic left hip, groin, and lower abdominal pain. His current symptoms seem to be consistent with left adductor and hip flexor related pain. He did not report any intraarticular hip related symptoms this visit. He does exhibit strength and flexibility deficits of the left hip that is likely contributing to his pain and impacting his functional ability to participate in exercise and pickleball.   OBJECTIVE IMPAIRMENTS: decreased activity tolerance, decreased strength, impaired flexibility, and pain.   ACTIVITY LIMITATIONS: lifting, squatting, sleeping, and locomotion level  PARTICIPATION LIMITATIONS: community activity  PERSONAL FACTORS:  Time since onset of injury/illness/exacerbation are also affecting patient's functional outcome.    GOALS: Goals reviewed with patient? Yes  SHORT TERM GOALS: Target date: 06/19/2024  Patient will be I with initial HEP in order to progress with therapy. Baseline: HEP provided at eval 06/17/2024: independent Goal status: MET  2.  Patient will report left hip/thigh pain with activity and exercise </= 2/10 in order to reduce functional limitations Baseline: 5/10 06/17/2024: 2/10 Goal status: MET  LONG TERM GOALS: Target date: 07/17/2024  Patient will be I with final HEP to maintain progress from PT. Baseline: HEP provided at eval Goal status: INITIAL  2.  Patient will report LEFS >/= 72/80/80 in order to indicate improvement in functional status. Baseline: 50/80 Goal status: INITIAL  3.  Patient will demonstrate left hip strength >/= 4+/5 MMT in order to improve activity tolerance and confidence with activities such as pickleball Baseline: see limitations above Goal status: INITIAL  4.  Patient will report return to activites such as pickleball and exercise with increased pain or limitation to indicate return to prior level of function Baseline: unable to participate in pickleball Goal status: INITIAL   PLAN: PT FREQUENCY: 1x/week  PT DURATION: 8 weeks  PLANNED INTERVENTIONS: 97164- PT Re-evaluation, 97750- Physical Performance Testing, 97110-Therapeutic exercises, 97530- Therapeutic activity, 97112- Neuromuscular re-education, 97535- Self Care, 02859- Manual therapy, 20560 (1-2 muscles), 20561 (3+ muscles)- Dry Needling, Patient/Family education, Balance training, Joint mobilization, Joint manipulation, Spinal manipulation, Spinal mobilization, Cryotherapy, and Moist heat  PLAN FOR NEXT SESSION: Review HEP and progress PRN, manual/TPDN for  the left hip and thigh region, initiate flexibility for the left hip and thigh, progress hip and core strengthening as tolerated,  initiate more dynamic functional movements to improve tolerance for activity   Elaine Daring, PT, DPT, LAT, ATC 06/17/24  9:50 AM Phone: [phone] Fax: [phone]

## 2024-06-24 ENCOUNTER — Ambulatory Visit: Admitting: Physical Therapy

## 2024-06-24 ENCOUNTER — Encounter: Payer: Self-pay | Admitting: Physical Therapy

## 2024-06-24 ENCOUNTER — Other Ambulatory Visit: Payer: Self-pay

## 2024-06-24 DIAGNOSIS — M6281 Muscle weakness (generalized): Secondary | ICD-10-CM

## 2024-06-24 DIAGNOSIS — M25552 Pain in left hip: Secondary | ICD-10-CM

## 2024-06-24 DIAGNOSIS — M79652 Pain in left thigh: Secondary | ICD-10-CM | POA: Diagnosis not present

## 2024-06-24 NOTE — Therapy (Signed)
 OUTPATIENT PHYSICAL THERAPY TREATMENT   Patient Name: Benjamin Ho MRN: [id number] DOB:1971/06/24, 53 y.o., male Today's Date: 06/24/2024   END OF SESSION:  PT End of Session - 06/24/24 0748     Visit Number 5    Number of Visits 9    Date for Recertification  07/17/24    Authorization Type [employer]    PT Start Time 0800    PT Stop Time 0854    PT Time Calculation (min) 54 min    Activity Tolerance Patient tolerated treatment well    Behavior During Therapy [facility] for tasks assessed/performed              Past Medical History:  Diagnosis Date   Allergy    per pt   Anxiety    GERD (gastroesophageal reflux disease)    per pt   Past Surgical History:  Procedure Laterality Date   CHOLECYSTECTOMY N/A 08/29/2022   Procedure: LAPAROSCOPIC CHOLECYSTECTOMY;  Surgeon: Tanda Locus, MD;  Location: [facility];  Service: General;  Laterality: N/A;   EUS N/A 04/26/2020   Procedure: LOWER ENDOSCOPIC ULTRASOUND (EUS);  Surgeon: Wilhelmenia Aloha Raddle., MD;  Location: [facility] ENDOSCOPY;  Service: Gastroenterology;  Laterality: N/A;   FINGER SURGERY     FLEXIBLE SIGMOIDOSCOPY N/A 04/26/2020   Procedure: FLEXIBLE SIGMOIDOSCOPY;  Surgeon: Wilhelmenia Aloha Raddle., MD;  Location: [facility] ENDOSCOPY;  Service: Gastroenterology;  Laterality: N/A;   mallet finger     pinky right hand   UPPER GASTROINTESTINAL ENDOSCOPY     8-10 yrs ago per pt.   WISDOM TOOTH EXTRACTION Bilateral 2022   Patient Active Problem List   Diagnosis Date Noted   Strain of left calf muscle 05/10/2023    PCP: Valentin Skates, DO  REFERRING PROVIDER: Joane Artist RAMAN, MD  REFERRING DIAG: Left thigh pain; Left hip pain  THERAPY DIAG:  Pain in left hip  Pain in left thigh  Muscle weakness (generalized)  Rationale for Evaluation and Treatment: Rehabilitation  ONSET DATE: Chronic, worsened 3 weeks ago from eval   SUBJECTIVE:  SUBJECTIVE STATEMENT: Patient reports he has been progressing with his  pickleball play and has not had any issues, he is still mainly just doing fwd/bwd movements, he is trying to gradually progress side-to-side movements and sudden movements. He reports that he was sore following the copenhagen planks and then he tried to do it at home and it didn't feel great.   Eval: Patient reports about the beginning of the summer he was doing something at home and he had to take a big step up and felt some pain in the front of the hip. He states he was not having that sensation all the time but then he was going up the stairs and his foot got stuck and he felt like he pulled a muscle in the left thigh so he did some exercises from youtube and it felt better. Then about 3 weeks ago he went to hit a ball on the right and his left leg stayed planted as he rotated away from the left leg and he felt like he pulled something in the inner thigh to the front of the hip. He stopped playing pickleball for about a week, then tried to go back and play but was not able to play due to pain in the front of the left hip, lower abdominals, and inner left thigh. He also noticed some swelling in the lower abdomen as well. So for the past week he has just rested  and used ice which has improved his pain. He reports pain mainly occurs when he tries to play pickleball and sometimes when he is walking around, if his foot gets stuck then he will get the pain. He also notices the pain when he goes to roll in bed and the left leg moves first or opens up, and when getting out of the car when he has to lift and move his leg out. He states it has become more discomfort than pain. The pain tends to be sharp and quick, and then will go away once he repositions the leg. He did notice one instance while riding in a car for hours where he was having some pain in the right leg and on the outside of the right foot. He was working out everyday prior to the injury, where he was playing pickleball and doing core  exercises.  PERTINENT HISTORY: See PMH above  PAIN:  Are you having pain? Yes:  NPRS scale: 0/10 currently, 2/10 at worst Pain location: Left anterior hip, lower abdominal, inner thigh Pain description: Sharp Aggravating factors: Movement of the left hip, pickleball Relieving factors: Reposition the left leg out of painful position  PRECAUTIONS: None  PATIENT GOALS: Get back to exercise and pickleball   OBJECTIVE:  Note: Objective measures were completed at Evaluation unless otherwise noted. PATIENT SURVEYS:  LEFS: 50/80  MUSCLE LENGTH: Slight limitation with left hip flexor and adductor length  PALPATION: Patient reports tenderness to palpation of left proximal adductor region, hip flexor just medial to ASIS, TFL, deep palpation to hip flexor in lower abdominal region  LOWER EXTREMITY ROM:   Hip PROM grossly WFL and non painful  LOWER EXTREMITY MMT:  MMT Right eval Left eval Left 06/10/2024  Hip flexion 4+ 4 4+  Hip extension 4 4 4   Hip abduction 4+ 4 4  Hip adduction     Hip internal rotation     Hip external rotation     Knee flexion 5 5   Knee extension 5 5   Ankle dorsiflexion     Ankle plantarflexion     Ankle inversion     Ankle eversion      (Blank rows = not tested)  FUNCTIONAL TESTS:  Squat: grossly WFL, no pain reported SL squat: patient exhibits decreased control and difficulty bilaterally, no pain reported DLLT: 30 deg without pain  GAIT: Assistive device utilized: None Level of assistance: Complete Independence Comments: grossly WFL                                                                                                                               TREATMENT  [facility] Adult PT Treatment:  DATE: 06/24/2024 Recumbent bike L5 x 5 min to improve endurance and workload capacity 90/90/90 hip stretch  Bridge with hamstring curl using stability 3 x 10 Copenhagen plank with knee bent 2 x 20  sec Copenhagen plank with knee bent and bottom leg lift 2 x 6 Lateral slider lunge 2 x 8 each  Continued discussion gradual progression back to pickleball, running, adductor exercises at home  PATIENT EDUCATION:  Education details: HEP update Person educated: Patient Education method: Explanation, Demonstration, Tactile cues, Verbal cues, Handouts Education comprehension: verbalized understanding, returned demonstration, verbal cues required, tactile cues required, and needs further education  HOME EXERCISE PROGRAM: Access Code: [email or web address]    ASSESSMENT: CLINICAL IMPRESSION: Patient tolerated therapy well with no adverse effects. Therapy continued to focus on improving his hip mobility and progressing his adductor, core, and hip strengthening and control with good tolerance. He reports continued left hip tightness so incorporated 90/90/90 stretch for left hip IR mobility. He did report the straight leg copenhagen plank was too challenging so worked on knee bent variation with better tolerance. Also incorporated lateral slider lunge for more adductor strength and control. Updated HEP to perform copenhagen planks with knee bent and incorporate the 90/90/90 hip stretch. Also, continued discussing on gradual progression back to pickleball and exercise. Patient would benefit from continued skilled PT to progress mobility and strength in order to reduce pain and maximize functional ability.   Eval: Patient is a 53 y.o. male who was seen today for physical therapy evaluation and treatment for acute on chronic left hip, groin, and lower abdominal pain. His current symptoms seem to be consistent with left adductor and hip flexor related pain. He did not report any intraarticular hip related symptoms this visit. He does exhibit strength and flexibility deficits of the left hip that is likely contributing to his pain and impacting his functional ability to participate in exercise and pickleball.    OBJECTIVE IMPAIRMENTS: decreased activity tolerance, decreased strength, impaired flexibility, and pain.   ACTIVITY LIMITATIONS: lifting, squatting, sleeping, and locomotion level  PARTICIPATION LIMITATIONS: community activity  PERSONAL FACTORS: Time since onset of injury/illness/exacerbation are also affecting patient's functional outcome.    GOALS: Goals reviewed with patient? Yes  SHORT TERM GOALS: Target date: 06/19/2024  Patient will be I with initial HEP in order to progress with therapy. Baseline: HEP provided at eval 06/17/2024: independent Goal status: MET  2.  Patient will report left hip/thigh pain with activity and exercise </= 2/10 in order to reduce functional limitations Baseline: 5/10 06/17/2024: 2/10 Goal status: MET  LONG TERM GOALS: Target date: 07/17/2024  Patient will be I with final HEP to maintain progress from PT. Baseline: HEP provided at eval Goal status: INITIAL  2.  Patient will report LEFS >/= 72/80/80 in order to indicate improvement in functional status. Baseline: 50/80 Goal status: INITIAL  3.  Patient will demonstrate left hip strength >/= 4+/5 MMT in order to improve activity tolerance and confidence with activities such as pickleball Baseline: see limitations above Goal status: INITIAL  4.  Patient will report return to activites such as pickleball and exercise with increased pain or limitation to indicate return to prior level of function Baseline: unable to participate in pickleball Goal status: INITIAL   PLAN: PT FREQUENCY: 1x/week  PT DURATION: 8 weeks  PLANNED INTERVENTIONS: 97164- PT Re-evaluation, 97750- Physical Performance Testing, 97110-Therapeutic exercises, 97530- Therapeutic activity, [id number]- Neuromuscular re-education, 97535- Self Care, 02859- Manual therapy, 20560 (1-2 muscles), 20561 (3+ muscles)- Dry Needling, Patient/Family education,  Balance training, Joint mobilization, Joint manipulation, Spinal manipulation,  Spinal mobilization, Cryotherapy, and Moist heat  PLAN FOR NEXT SESSION: Review HEP and progress PRN, manual/TPDN for the left hip and thigh region, initiate flexibility for the left hip and thigh, progress hip and core strengthening as tolerated, initiate more dynamic functional movements to improve tolerance for activity   Elaine Daring, PT, DPT, LAT, ATC 06/24/24  1:00 PM Phone: [phone] Fax: [phone]

## 2024-07-02 ENCOUNTER — Other Ambulatory Visit: Payer: Self-pay

## 2024-07-02 ENCOUNTER — Ambulatory Visit: Admitting: Physical Therapy

## 2024-07-02 ENCOUNTER — Encounter: Payer: Self-pay | Admitting: Physical Therapy

## 2024-07-02 DIAGNOSIS — M79652 Pain in left thigh: Secondary | ICD-10-CM | POA: Diagnosis not present

## 2024-07-02 DIAGNOSIS — M6281 Muscle weakness (generalized): Secondary | ICD-10-CM | POA: Diagnosis not present

## 2024-07-02 DIAGNOSIS — M25552 Pain in left hip: Secondary | ICD-10-CM

## 2024-07-02 NOTE — Therapy (Signed)
 OUTPATIENT PHYSICAL THERAPY TREATMENT   Patient Name: Benjamin Ho MRN: [id number] DOB:05-05-1971, 53 y.o., male Today's Date: 07/02/2024   END OF SESSION:  PT End of Session - 07/02/24 0755     Visit Number 6    Number of Visits 9    Date for Recertification  07/17/24    Authorization Type [employer]    PT Start Time 0800    PT Stop Time 0855    PT Time Calculation (min) 55 min    Activity Tolerance Patient tolerated treatment well    Behavior During Therapy [facility] for tasks assessed/performed               Past Medical History:  Diagnosis Date   Allergy    per pt   Anxiety    GERD (gastroesophageal reflux disease)    per pt   Past Surgical History:  Procedure Laterality Date   CHOLECYSTECTOMY N/A 08/29/2022   Procedure: LAPAROSCOPIC CHOLECYSTECTOMY;  Surgeon: Tanda Locus, MD;  Location: [facility];  Service: General;  Laterality: N/A;   EUS N/A 04/26/2020   Procedure: LOWER ENDOSCOPIC ULTRASOUND (EUS);  Surgeon: Wilhelmenia Aloha Raddle., MD;  Location: [facility] ENDOSCOPY;  Service: Gastroenterology;  Laterality: N/A;   FINGER SURGERY     FLEXIBLE SIGMOIDOSCOPY N/A 04/26/2020   Procedure: FLEXIBLE SIGMOIDOSCOPY;  Surgeon: Wilhelmenia Aloha Raddle., MD;  Location: [facility] ENDOSCOPY;  Service: Gastroenterology;  Laterality: N/A;   mallet finger     pinky right hand   UPPER GASTROINTESTINAL ENDOSCOPY     8-10 yrs ago per pt.   WISDOM TOOTH EXTRACTION Bilateral 2022   Patient Active Problem List   Diagnosis Date Noted   Strain of left calf muscle 05/10/2023    PCP: Valentin Skates, DO  REFERRING PROVIDER: Joane Artist RAMAN, MD  REFERRING DIAG: Left thigh pain; Left hip pain  THERAPY DIAG:  Pain in left hip  Pain in left thigh  Muscle weakness (generalized)  Rationale for Evaluation and Treatment: Rehabilitation  ONSET DATE: Chronic, worsened 3 weeks ago from eval   SUBJECTIVE:  SUBJECTIVE STATEMENT: Patient reports things are going very well. He played  pickleball on Monday and is still being careful with side steps or rushing forward, but he is doing more than he was before.  Eval: Patient reports about the beginning of the summer he was doing something at home and he had to take a big step up and felt some pain in the front of the hip. He states he was not having that sensation all the time but then he was going up the stairs and his foot got stuck and he felt like he pulled a muscle in the left thigh so he did some exercises from youtube and it felt better. Then about 3 weeks ago he went to hit a ball on the right and his left leg stayed planted as he rotated away from the left leg and he felt like he pulled something in the inner thigh to the front of the hip. He stopped playing pickleball for about a week, then tried to go back and play but was not able to play due to pain in the front of the left hip, lower abdominals, and inner left thigh. He also noticed some swelling in the lower abdomen as well. So for the past week he has just rested and used ice which has improved his pain. He reports pain mainly occurs when he tries to play pickleball and sometimes when he is walking around, if  his foot gets stuck then he will get the pain. He also notices the pain when he goes to roll in bed and the left leg moves first or opens up, and when getting out of the car when he has to lift and move his leg out. He states it has become more discomfort than pain. The pain tends to be sharp and quick, and then will go away once he repositions the leg. He did notice one instance while riding in a car for hours where he was having some pain in the right leg and on the outside of the right foot. He was working out everyday prior to the injury, where he was playing pickleball and doing core exercises.  PERTINENT HISTORY: See PMH above  PAIN:  Are you having pain? Yes:  NPRS scale: 0/10 currently, 2/10 at worst Pain location: Left anterior hip, lower abdominal, inner  thigh Pain description: Sharp Aggravating factors: Movement of the left hip, pickleball Relieving factors: Reposition the left leg out of painful position  PRECAUTIONS: None  PATIENT GOALS: Get back to exercise and pickleball   OBJECTIVE:  Note: Objective measures were completed at Evaluation unless otherwise noted. PATIENT SURVEYS:  LEFS: 50/80  MUSCLE LENGTH: Slight limitation with left hip flexor and adductor length  PALPATION: Patient reports tenderness to palpation of left proximal adductor region, hip flexor just medial to ASIS, TFL, deep palpation to hip flexor in lower abdominal region  LOWER EXTREMITY ROM:   Hip PROM grossly WFL and non painful  LOWER EXTREMITY MMT:  MMT Right eval Left eval Left 06/10/2024  Hip flexion 4+ 4 4+  Hip extension 4 4 4   Hip abduction 4+ 4 4  Hip adduction     Hip internal rotation     Hip external rotation     Knee flexion 5 5   Knee extension 5 5   Ankle dorsiflexion     Ankle plantarflexion     Ankle inversion     Ankle eversion      (Blank rows = not tested)  FUNCTIONAL TESTS:  Squat: grossly WFL, no pain reported SL squat: patient exhibits decreased control and difficulty bilaterally, no pain reported DLLT: 30 deg without pain  GAIT: Assistive device utilized: None Level of assistance: Complete Independence Comments: grossly WFL                                                                                                                               TREATMENT  [facility] Adult PT Treatment:                                                DATE: 07/02/2024 Treadmill walking 3.6 mph at 2% incline x 4 min to improve endurance and workload capacity Leg kicks fwd/bwd and lateral x 10 each Sumo  squat with KB 30# x 10, 40# x 10, 50# 3 x 6 Lateral stepping with L5 powerband resistance at waist 2 x 90 ft each SL RDL holding 12# med ball into runner with overhead press 2 x 6 each Lateral skater hop 2 x 20 Pallof press with L3  powerband 2 x 8 each Hip flexor lift and eccentric lowering in thomas position with 10# ankle weight around foot 3 x 6 each Standing dowel press anti-rotation with L2 powerband 2 x 8 each  PATIENT EDUCATION:  Education details: HEP Person educated: Patient Education method: Programmer, Multimedia, Facilities Manager, Verbal cues Education comprehension: verbalized understanding, returned demonstration, verbal cues required, and needs further education  HOME EXERCISE PROGRAM: Access Code: [email or web address]    ASSESSMENT: CLINICAL IMPRESSION: Patient tolerated therapy well with no adverse effects. Therapy focused on progressing strength and control of the core, glutes, adductors and hip flexors with good tolerance. He was able to complete all exercises without pain, but does report continue feeling strength deficit on the left primarily with hip flexor exercises this visit. Incorporate more plyometric movement with skater hop to improve his comfort level with more explosive lateral movements with pickleball. Overall he is progressing well and was encouraged continued gradual progression with pickleball. Patient would benefit from continued skilled PT to progress mobility and strength in order to reduce pain and maximize functional ability.   Eval: Patient is a 53 y.o. male who was seen today for physical therapy evaluation and treatment for acute on chronic left hip, groin, and lower abdominal pain. His current symptoms seem to be consistent with left adductor and hip flexor related pain. He did not report any intraarticular hip related symptoms this visit. He does exhibit strength and flexibility deficits of the left hip that is likely contributing to his pain and impacting his functional ability to participate in exercise and pickleball.   OBJECTIVE IMPAIRMENTS: decreased activity tolerance, decreased strength, impaired flexibility, and pain.   ACTIVITY LIMITATIONS: lifting, squatting, sleeping, and locomotion  level  PARTICIPATION LIMITATIONS: community activity  PERSONAL FACTORS: Time since onset of injury/illness/exacerbation are also affecting patient's functional outcome.    GOALS: Goals reviewed with patient? Yes  SHORT TERM GOALS: Target date: 06/19/2024  Patient will be I with initial HEP in order to progress with therapy. Baseline: HEP provided at eval 06/17/2024: independent Goal status: MET  2.  Patient will report left hip/thigh pain with activity and exercise </= 2/10 in order to reduce functional limitations Baseline: 5/10 06/17/2024: 2/10 Goal status: MET  LONG TERM GOALS: Target date: 07/17/2024  Patient will be I with final HEP to maintain progress from PT. Baseline: HEP provided at eval Goal status: INITIAL  2.  Patient will report LEFS >/= 72/80/80 in order to indicate improvement in functional status. Baseline: 50/80 Goal status: INITIAL  3.  Patient will demonstrate left hip strength >/= 4+/5 MMT in order to improve activity tolerance and confidence with activities such as pickleball Baseline: see limitations above Goal status: INITIAL  4.  Patient will report return to activites such as pickleball and exercise with increased pain or limitation to indicate return to prior level of function Baseline: unable to participate in pickleball Goal status: INITIAL   PLAN: PT FREQUENCY: 1x/week  PT DURATION: 8 weeks  PLANNED INTERVENTIONS: 97164- PT Re-evaluation, 97750- Physical Performance Testing, 97110-Therapeutic exercises, 97530- Therapeutic activity, 97112- Neuromuscular re-education, 97535- Self Care, 02859- Manual therapy, 20560 (1-2 muscles), 20561 (3+ muscles)- Dry Needling, Patient/Family education, Balance training, Joint mobilization, Joint manipulation, Spinal  manipulation, Spinal mobilization, Cryotherapy, and Moist heat  PLAN FOR NEXT SESSION: Review HEP and progress PRN, manual/TPDN for the left hip and thigh region, initiate flexibility for the  left hip and thigh, progress hip and core strengthening as tolerated, initiate more dynamic functional movements to improve tolerance for activity   Elaine Daring, PT, DPT, LAT, ATC 07/02/24  9:04 AM Phone: [phone] Fax: [phone]

## 2024-07-11 ENCOUNTER — Encounter: Payer: Self-pay | Admitting: Physical Therapy

## 2024-07-11 ENCOUNTER — Other Ambulatory Visit: Payer: Self-pay

## 2024-07-11 ENCOUNTER — Ambulatory Visit: Admitting: Physical Therapy

## 2024-07-11 ENCOUNTER — Ambulatory Visit: Admitting: Family Medicine

## 2024-07-11 VITALS — BP 118/80 | HR 119 | Ht 68.5 in

## 2024-07-11 DIAGNOSIS — M6281 Muscle weakness (generalized): Secondary | ICD-10-CM | POA: Diagnosis not present

## 2024-07-11 DIAGNOSIS — M25552 Pain in left hip: Secondary | ICD-10-CM | POA: Diagnosis not present

## 2024-07-11 DIAGNOSIS — M79652 Pain in left thigh: Secondary | ICD-10-CM

## 2024-07-11 NOTE — Progress Notes (Signed)
° °  LILLETTE Ileana Collet, PhD, LAT, ATC acting as a scribe for Artist Lloyd, MD.  Benjamin Ho is a 53 y.o. male who presents to [facility] today for f/u L hip/thigh pain. Pt was last seen by Dr. Lloyd on 05/12/24 and was advised to use compression and was referred to PT, completing 6 visits.  Today, pt reports L hip/thigh is feeling much better. He also feels like his LLQ is feeling better. He's really enjoyed his experience w/ PT.   Dx imaging: 05/12/24 L hip XR  Pertinent review of systems: As or chills  Relevant historical information: Otherwise healthy   Exam:  BP 118/80   Pulse (!) 119   Ht 5' 8.5 (1.74 m)   SpO2 94%   BMI 25.02 kg/m  General: Well Developed, well nourished, and in no acute distress.   MSK: Normal gait    Lab and Radiology Results No results found for this or any previous visit (from the past 72 hours). No results found.     Assessment and Plan: 53 y.o. male with neck hip and back pain improved with PT.  Plan to finish out PT and check back with me as needed. Continue home exercise program.  PDMP not reviewed this encounter. No orders of the defined types were placed in this encounter.  No orders of the defined types were placed in this encounter.    Discussed warning signs or symptoms. Please see discharge instructions. Patient expresses understanding.   The above documentation has been reviewed and is accurate and complete Artist Lloyd, M.D.

## 2024-07-11 NOTE — Patient Instructions (Signed)
 Thank you for coming in today.

## 2024-07-11 NOTE — Therapy (Signed)
 OUTPATIENT PHYSICAL THERAPY TREATMENT   Patient Name: Benjamin Ho MRN: [id number] DOB:1971-07-29, 53 y.o., male Today's Date: 07/11/2024   END OF SESSION:  PT End of Session - 07/11/24 0759     Visit Number 7    Number of Visits 9    Date for Recertification  07/17/24    Authorization Type [employer]    PT Start Time 0815    PT Stop Time 0910    PT Time Calculation (min) 55 min    Activity Tolerance Patient tolerated treatment well    Behavior During Therapy [facility] for tasks assessed/performed                Past Medical History:  Diagnosis Date   Allergy    per pt   Anxiety    GERD (gastroesophageal reflux disease)    per pt   Past Surgical History:  Procedure Laterality Date   CHOLECYSTECTOMY N/A 08/29/2022   Procedure: LAPAROSCOPIC CHOLECYSTECTOMY;  Surgeon: Tanda Locus, MD;  Location: [facility];  Service: General;  Laterality: N/A;   EUS N/A 04/26/2020   Procedure: LOWER ENDOSCOPIC ULTRASOUND (EUS);  Surgeon: Wilhelmenia Aloha Raddle., MD;  Location: [facility] ENDOSCOPY;  Service: Gastroenterology;  Laterality: N/A;   FINGER SURGERY     FLEXIBLE SIGMOIDOSCOPY N/A 04/26/2020   Procedure: FLEXIBLE SIGMOIDOSCOPY;  Surgeon: Wilhelmenia Aloha Raddle., MD;  Location: [facility] ENDOSCOPY;  Service: Gastroenterology;  Laterality: N/A;   mallet finger     pinky right hand   UPPER GASTROINTESTINAL ENDOSCOPY     8-10 yrs ago per pt.   WISDOM TOOTH EXTRACTION Bilateral 2022   Patient Active Problem List   Diagnosis Date Noted   Strain of left calf muscle 05/10/2023    PCP: Valentin Skates, DO  REFERRING PROVIDER: Joane Artist RAMAN, MD  REFERRING DIAG: Left thigh pain; Left hip pain  THERAPY DIAG:  Pain in left hip  Pain in left thigh  Muscle weakness (generalized)  Rationale for Evaluation and Treatment: Rehabilitation  ONSET DATE: Chronic, worsened 3 weeks ago from eval   SUBJECTIVE:  SUBJECTIVE STATEMENT: Patient reports he is doing well, his back is a  little stiff this morning. He asks about using a theragun to work on his muscles. He has started to do the adductor plank with the leg straight and can hold for about 10 seconds.   Eval: Patient reports about the beginning of the summer he was doing something at home and he had to take a big step up and felt some pain in the front of the hip. He states he was not having that sensation all the time but then he was going up the stairs and his foot got stuck and he felt like he pulled a muscle in the left thigh so he did some exercises from youtube and it felt better. Then about 3 weeks ago he went to hit a ball on the right and his left leg stayed planted as he rotated away from the left leg and he felt like he pulled something in the inner thigh to the front of the hip. He stopped playing pickleball for about a week, then tried to go back and play but was not able to play due to pain in the front of the left hip, lower abdominals, and inner left thigh. He also noticed some swelling in the lower abdomen as well. So for the past week he has just rested and used ice which has improved his pain. He reports pain mainly occurs  when he tries to play pickleball and sometimes when he is walking around, if his foot gets stuck then he will get the pain. He also notices the pain when he goes to roll in bed and the left leg moves first or opens up, and when getting out of the car when he has to lift and move his leg out. He states it has become more discomfort than pain. The pain tends to be sharp and quick, and then will go away once he repositions the leg. He did notice one instance while riding in a car for hours where he was having some pain in the right leg and on the outside of the right foot. He was working out everyday prior to the injury, where he was playing pickleball and doing core exercises.  PERTINENT HISTORY: See PMH above  PAIN:  Are you having pain? Yes:  NPRS scale: 0/10 currently, 2/10 at worst Pain  location: Left anterior hip, lower abdominal, inner thigh Pain description: Sharp Aggravating factors: Movement of the left hip, pickleball Relieving factors: Reposition the left leg out of painful position  PRECAUTIONS: None  PATIENT GOALS: Get back to exercise and pickleball   OBJECTIVE:  Note: Objective measures were completed at Evaluation unless otherwise noted. PATIENT SURVEYS:  LEFS: 50/80  MUSCLE LENGTH: Slight limitation with left hip flexor and adductor length  PALPATION: Patient reports tenderness to palpation of left proximal adductor region, hip flexor just medial to ASIS, TFL, deep palpation to hip flexor in lower abdominal region  LOWER EXTREMITY ROM:   Hip PROM grossly WFL and non painful  LOWER EXTREMITY MMT:  MMT Right eval Left eval Left 06/10/2024  Hip flexion 4+ 4 4+  Hip extension 4 4 4   Hip abduction 4+ 4 4  Hip adduction     Hip internal rotation     Hip external rotation     Knee flexion 5 5   Knee extension 5 5   Ankle dorsiflexion     Ankle plantarflexion     Ankle inversion     Ankle eversion      (Blank rows = not tested)  FUNCTIONAL TESTS:  Squat: grossly WFL, no pain reported SL squat: patient exhibits decreased control and difficulty bilaterally, no pain reported DLLT: 30 deg without pain  GAIT: Assistive device utilized: None Level of assistance: Complete Independence Comments: grossly WFL                                                                                                                               TREATMENT  [facility] Adult PT Treatment:                                                DATE: 07/11/2024 Lunge circuit x 5 each leg Leg kicks/swings fwd/bwd and lateral  x 10 each Rear foot elevated hopping vertical 2 x 15 each, lateral over line 2 x 10 each, vertical hop with ball toss 2 x 30 sec Med ball lateral bound 2 x 10 each Med ball lateral bound with throw 2 x 8 each Pallof press with overhead raise 2 x 6  each Split squat rotational med ball throw 2 x 10 each Nordic hamstring 3 x 6 Side plank with crunch 2 x 12 each  PATIENT EDUCATION:  Education details: HEP Person educated: Patient Education method: Programmer, Multimedia, Facilities Manager, Verbal cues Education comprehension: verbalized understanding, returned demonstration, verbal cues required, and needs further education  HOME EXERCISE PROGRAM: Access Code: [email or web address]    ASSESSMENT: CLINICAL IMPRESSION: Patient tolerated therapy well with no adverse effects. Therapy focused on on progressing plyometrics and strength with good tolerance. Incorporated more hopping in various planes and with additional tasks, as well as more med ball exercises to progress into more dynamic and explosive movements. He does report feeling like he has to concentrate and work harder when performing exercises on the left side but did not report any pain with therapy this visit. He is progressing well with his strengthening and incorporated more hamstring and core work in this visit. No changes made to his HEP. Patient would benefit from continued skilled PT to progress mobility and strength in order to reduce pain and maximize functional ability.   Eval: Patient is a 53 y.o. male who was seen today for physical therapy evaluation and treatment for acute on chronic left hip, groin, and lower abdominal pain. His current symptoms seem to be consistent with left adductor and hip flexor related pain. He did not report any intraarticular hip related symptoms this visit. He does exhibit strength and flexibility deficits of the left hip that is likely contributing to his pain and impacting his functional ability to participate in exercise and pickleball.   OBJECTIVE IMPAIRMENTS: decreased activity tolerance, decreased strength, impaired flexibility, and pain.   ACTIVITY LIMITATIONS: lifting, squatting, sleeping, and locomotion level  PARTICIPATION LIMITATIONS: community  activity  PERSONAL FACTORS: Time since onset of injury/illness/exacerbation are also affecting patient's functional outcome.    GOALS: Goals reviewed with patient? Yes  SHORT TERM GOALS: Target date: 06/19/2024  Patient will be I with initial HEP in order to progress with therapy. Baseline: HEP provided at eval 06/17/2024: independent Goal status: MET  2.  Patient will report left hip/thigh pain with activity and exercise </= 2/10 in order to reduce functional limitations Baseline: 5/10 06/17/2024: 2/10 Goal status: MET  LONG TERM GOALS: Target date: 07/17/2024  Patient will be I with final HEP to maintain progress from PT. Baseline: HEP provided at eval Goal status: INITIAL  2.  Patient will report LEFS >/= 72/80/80 in order to indicate improvement in functional status. Baseline: 50/80 Goal status: INITIAL  3.  Patient will demonstrate left hip strength >/= 4+/5 MMT in order to improve activity tolerance and confidence with activities such as pickleball Baseline: see limitations above Goal status: INITIAL  4.  Patient will report return to activites such as pickleball and exercise with increased pain or limitation to indicate return to prior level of function Baseline: unable to participate in pickleball Goal status: INITIAL   PLAN: PT FREQUENCY: 1x/week  PT DURATION: 8 weeks  PLANNED INTERVENTIONS: 97164- PT Re-evaluation, 97750- Physical Performance Testing, 97110-Therapeutic exercises, 97530- Therapeutic activity, 97112- Neuromuscular re-education, 97535- Self Care, 02859- Manual therapy, 20560 (1-2 muscles), 20561 (3+ muscles)- Dry Needling, Patient/Family education, Balance training, Joint  mobilization, Joint manipulation, Spinal manipulation, Spinal mobilization, Cryotherapy, and Moist heat  PLAN FOR NEXT SESSION: Review HEP and progress PRN, manual/TPDN for the left hip and thigh region, initiate flexibility for the left hip and thigh, progress hip and core  strengthening as tolerated, initiate more dynamic functional movements to improve tolerance for activity   Elaine Daring, PT, DPT, LAT, ATC 07/11/2024  9:23 AM Phone: [phone] Fax: [phone]

## 2024-07-16 ENCOUNTER — Encounter: Payer: Self-pay | Admitting: Physical Therapy

## 2024-07-16 ENCOUNTER — Ambulatory Visit: Admitting: Physical Therapy

## 2024-07-16 ENCOUNTER — Other Ambulatory Visit: Payer: Self-pay

## 2024-07-16 DIAGNOSIS — M79652 Pain in left thigh: Secondary | ICD-10-CM

## 2024-07-16 DIAGNOSIS — M25552 Pain in left hip: Secondary | ICD-10-CM | POA: Diagnosis not present

## 2024-07-16 DIAGNOSIS — M6281 Muscle weakness (generalized): Secondary | ICD-10-CM

## 2024-07-16 NOTE — Therapy (Signed)
 OUTPATIENT PHYSICAL THERAPY TREATMENT   Patient Name: Benjamin Ho MRN: [id number] DOB:05/10/1971, 53 y.o., male Today's Date: 07/16/2024   END OF SESSION:  PT End of Session - 07/16/24 0751     Visit Number 8    Number of Visits 14    Date for Recertification  08/27/24    Authorization Type [employer]    PT Start Time [id number]    PT Stop Time 0845    PT Time Calculation (min) 54 min    Activity Tolerance Patient tolerated treatment well    Behavior During Therapy [facility] for tasks assessed/performed                 Past Medical History:  Diagnosis Date   Allergy    per pt   Anxiety    GERD (gastroesophageal reflux disease)    per pt   Past Surgical History:  Procedure Laterality Date   CHOLECYSTECTOMY N/A 08/29/2022   Procedure: LAPAROSCOPIC CHOLECYSTECTOMY;  Surgeon: Tanda Locus, MD;  Location: [facility];  Service: General;  Laterality: N/A;   EUS N/A 04/26/2020   Procedure: LOWER ENDOSCOPIC ULTRASOUND (EUS);  Surgeon: Wilhelmenia Aloha Raddle., MD;  Location: [facility] ENDOSCOPY;  Service: Gastroenterology;  Laterality: N/A;   FINGER SURGERY     FLEXIBLE SIGMOIDOSCOPY N/A 04/26/2020   Procedure: FLEXIBLE SIGMOIDOSCOPY;  Surgeon: Wilhelmenia Aloha Raddle., MD;  Location: [facility] ENDOSCOPY;  Service: Gastroenterology;  Laterality: N/A;   mallet finger     pinky right hand   UPPER GASTROINTESTINAL ENDOSCOPY     8-10 yrs ago per pt.   WISDOM TOOTH EXTRACTION Bilateral 2022   Patient Active Problem List   Diagnosis Date Noted   Strain of left calf muscle 05/10/2023    PCP: Valentin Skates, DO  REFERRING PROVIDER: Joane Artist RAMAN, MD  REFERRING DIAG: Left thigh pain; Left hip pain  THERAPY DIAG:  Pain in left hip  Pain in left thigh  Muscle weakness (generalized)  Rationale for Evaluation and Treatment: Rehabilitation  ONSET DATE: Chronic, worsened 3 weeks ago from eval   SUBJECTIVE:  SUBJECTIVE STATEMENT: Patient reports he played pickleball for 2 hours  the other day without any pain. He states he wasn't playing at 100% yet.  Eval: Patient reports about the beginning of the summer he was doing something at home and he had to take a big step up and felt some pain in the front of the hip. He states he was not having that sensation all the time but then he was going up the stairs and his foot got stuck and he felt like he pulled a muscle in the left thigh so he did some exercises from youtube and it felt better. Then about 3 weeks ago he went to hit a ball on the right and his left leg stayed planted as he rotated away from the left leg and he felt like he pulled something in the inner thigh to the front of the hip. He stopped playing pickleball for about a week, then tried to go back and play but was not able to play due to pain in the front of the left hip, lower abdominals, and inner left thigh. He also noticed some swelling in the lower abdomen as well. So for the past week he has just rested and used ice which has improved his pain. He reports pain mainly occurs when he tries to play pickleball and sometimes when he is walking around, if his foot gets stuck then he will get  the pain. He also notices the pain when he goes to roll in bed and the left leg moves first or opens up, and when getting out of the car when he has to lift and move his leg out. He states it has become more discomfort than pain. The pain tends to be sharp and quick, and then will go away once he repositions the leg. He did notice one instance while riding in a car for hours where he was having some pain in the right leg and on the outside of the right foot. He was working out everyday prior to the injury, where he was playing pickleball and doing core exercises.  PERTINENT HISTORY: See PMH above  PAIN:  Are you having pain? Yes:  NPRS scale: 0/10 currently, 1/10 at worst Pain location: Left anterior hip, lower abdominal, inner thigh Pain description: Sharp Aggravating factors:  Movement of the left hip, pickleball Relieving factors: Reposition the left leg out of painful position  PRECAUTIONS: None  PATIENT GOALS: Get back to exercise and pickleball   OBJECTIVE:  Note: Objective measures were completed at Evaluation unless otherwise noted. PATIENT SURVEYS:  LEFS: 50/80  07/16/2024: 77/80  MUSCLE LENGTH: Slight limitation with left hip flexor and adductor length  PALPATION: Patient reports tenderness to palpation of left proximal adductor region, hip flexor just medial to ASIS, TFL, deep palpation to hip flexor in lower abdominal region  LOWER EXTREMITY ROM:   Hip PROM grossly WFL and non painful  LOWER EXTREMITY MMT:  MMT Right eval Left eval Left 06/10/2024 Left 07/16/2024  Hip flexion 4+ 4 4+ 4+  Hip extension 4 4 4 4   Hip abduction 4+ 4 4 4+  Hip adduction      Hip internal rotation      Hip external rotation      Knee flexion 5 5    Knee extension 5 5    Ankle dorsiflexion      Ankle plantarflexion      Ankle inversion      Ankle eversion       (Blank rows = not tested)  FUNCTIONAL TESTS:  Squat: grossly WFL, no pain reported SL squat: patient exhibits decreased control and difficulty bilaterally, no pain reported DLLT: 30 deg without pain  GAIT: Assistive device utilized: None Level of assistance: Complete Independence Comments: grossly WFL                                                                                                                               TREATMENT  [facility] Adult PT Treatment:                                                DATE: 07/16/2024 Treadmill walking 3.6 mph at 2% incline x 5 min Lunge circuit x 5 each leg  Vertical squat jump x 10 Rear foot elevated vertical jump 2 x 10 each Med ball lateral bound 2 x 10 each Lateral shuffle with banded resistance at hips using L5 powerband 2 x 90 ft each Reverse lunge with front foot on 6 box holding 20# at chest 3 x 6 each SL bridge with 15# over hip 3 x 8  x 3 sec each Modified side plank with reach 3 x 6 each  PATIENT EDUCATION:  Education details: HEP Person educated: Patient Education method: Programmer, Multimedia, Facilities Manager, Verbal cues Education comprehension: verbalized understanding, returned demonstration, verbal cues required, and needs further education  HOME EXERCISE PROGRAM: Access Code: [email or web address]    ASSESSMENT: CLINICAL IMPRESSION: Patient tolerated therapy well with no adverse effects. Therapy continues to focus on progressing his strength, power, and endurance with good tolerance. He continues to progress well wit his plyometrics and strengthening exercises. He does still report greater difficulty on the left and fatigues quicker. Overall he reports great improvement in his functional status and is continuing to progress back to pickleball activity. He does continue to exhibit some glute strength deficits on the left. No changes to his HEP this visit. Patient would benefit from continued skilled PT to progress strength, power, and return to picklball in order to reduce pain and maximize functional ability, so will extend PT POC for 6 more weeks.   Eval: Patient is a 53 y.o. male who was seen today for physical therapy evaluation and treatment for acute on chronic left hip, groin, and lower abdominal pain. His current symptoms seem to be consistent with left adductor and hip flexor related pain. He did not report any intraarticular hip related symptoms this visit. He does exhibit strength and flexibility deficits of the left hip that is likely contributing to his pain and impacting his functional ability to participate in exercise and pickleball.   OBJECTIVE IMPAIRMENTS: decreased activity tolerance, decreased strength, impaired flexibility, and pain.   ACTIVITY LIMITATIONS: lifting, squatting, sleeping, and locomotion level  PARTICIPATION LIMITATIONS: community activity  PERSONAL FACTORS: Time since onset of  injury/illness/exacerbation are also affecting patient's functional outcome.    GOALS: Goals reviewed with patient? Yes  SHORT TERM GOALS: Target date: 06/19/2024  Patient will be I with initial HEP in order to progress with therapy. Baseline: HEP provided at eval 06/17/2024: independent Goal status: MET  2.  Patient will report left hip/thigh pain with activity and exercise </= 2/10 in order to reduce functional limitations Baseline: 5/10 06/17/2024: 2/10 Goal status: MET  LONG TERM GOALS: Target date: 08/27/2024  Patient will be I with final HEP to maintain progress from PT. Baseline: HEP provided at eval 07/16/2024: progressing Goal status: ONGOING  2.  Patient will report LEFS >/= 72/80/80 in order to indicate improvement in functional status. Baseline: 50/80 07/16/2024: 77/80 Goal status: MET  3.  Patient will demonstrate left hip strength >/= 4+/5 MMT in order to improve activity tolerance and confidence with activities such as pickleball Baseline: see limitations above 07/16/2024: see limitations above Goal status: ONGOING  4.  Patient will report return to activites such as pickleball and exercise without increased pain or limitation to indicate return to prior level of function Baseline: unable to participate in pickleball 07/16/2024: continuing to progress back to full participation in pickleball Goal status: ONGOING   PLAN: PT FREQUENCY: 1x/week  PT DURATION: 6 weeks  PLANNED INTERVENTIONS: 97164- PT Re-evaluation, 97750- Physical Performance Testing, 97110-Therapeutic exercises, 97530- Therapeutic activity, 97112- Neuromuscular re-education, 97535- Self Care, 02859- Manual therapy,  79439 (1-2 muscles), 20561 (3+ muscles)- Dry Needling, Patient/Family education, Balance training, Joint mobilization, Joint manipulation, Spinal manipulation, Spinal mobilization, Cryotherapy, and Moist heat  PLAN FOR NEXT SESSION: Review HEP and progress PRN, manual/TPDN for the  left hip and thigh region, initiate flexibility for the left hip and thigh, progress hip and core strengthening as tolerated, initiate more dynamic functional movements to improve tolerance for activity   Elaine Daring, PT, DPT, LAT, ATC 07/16/2024  9:02 AM Phone: [phone] Fax: [phone]

## 2024-07-21 ENCOUNTER — Ambulatory Visit: Admitting: Physical Therapy

## 2024-07-21 ENCOUNTER — Other Ambulatory Visit: Payer: Self-pay

## 2024-07-21 ENCOUNTER — Encounter: Payer: Self-pay | Admitting: Physical Therapy

## 2024-07-21 DIAGNOSIS — M25552 Pain in left hip: Secondary | ICD-10-CM

## 2024-07-21 DIAGNOSIS — M6281 Muscle weakness (generalized): Secondary | ICD-10-CM | POA: Diagnosis not present

## 2024-07-21 DIAGNOSIS — M79652 Pain in left thigh: Secondary | ICD-10-CM

## 2024-07-21 NOTE — Therapy (Signed)
 " OUTPATIENT PHYSICAL THERAPY TREATMENT   Patient Name: Benjamin Ho MRN: [id number] DOB:[birth year], 53 y.o., male Today's Date: 07/21/2024   END OF SESSION:  PT End of Session - 07/21/24 0837     Visit Number 9    Number of Visits 14    Date for Recertification  08/27/24    Authorization Type [employer]    PT Start Time [phone]    PT Stop Time 0930    PT Time Calculation (min) 55 min    Activity Tolerance Patient tolerated treatment well    Behavior During Therapy [facility] for tasks assessed/performed                  Past Medical History:  Diagnosis Date   Allergy    per pt   Anxiety    GERD (gastroesophageal reflux disease)    per pt   Past Surgical History:  Procedure Laterality Date   CHOLECYSTECTOMY N/A 08/29/2022   Procedure: LAPAROSCOPIC CHOLECYSTECTOMY;  Surgeon: Tanda Locus, MD;  Location: [facility];  Service: General;  Laterality: N/A;   EUS N/A 04/26/2020   Procedure: LOWER ENDOSCOPIC ULTRASOUND (EUS);  Surgeon: Wilhelmenia Aloha Raddle., MD;  Location: [facility] ENDOSCOPY;  Service: Gastroenterology;  Laterality: N/A;   FINGER SURGERY     FLEXIBLE SIGMOIDOSCOPY N/A 04/26/2020   Procedure: FLEXIBLE SIGMOIDOSCOPY;  Surgeon: Wilhelmenia Aloha Raddle., MD;  Location: [facility] ENDOSCOPY;  Service: Gastroenterology;  Laterality: N/A;   mallet finger     pinky right hand   UPPER GASTROINTESTINAL ENDOSCOPY     8-10 yrs ago per pt.   WISDOM TOOTH EXTRACTION Bilateral 2022   Patient Active Problem List   Diagnosis Date Noted   Strain of left calf muscle 05/10/2023    PCP: Valentin Skates, DO  REFERRING PROVIDER: Joane Artist RAMAN, MD  REFERRING DIAG: Left thigh pain; Left hip pain  THERAPY DIAG:  Pain in left hip  Pain in left thigh  Muscle weakness (generalized)  Rationale for Evaluation and Treatment: Rehabilitation  ONSET DATE: Chronic, worsened 3 weeks ago from eval   SUBJECTIVE:  SUBJECTIVE STATEMENT: Patient reports he played pickleball for 2 hours  yesterday, and his legs are tired, especially the left one.   Eval: Patient reports about the beginning of the summer he was doing something at home and he had to take a big step up and felt some pain in the front of the hip. He states he was not having that sensation all the time but then he was going up the stairs and his foot got stuck and he felt like he pulled a muscle in the left thigh so he did some exercises from youtube and it felt better. Then about 3 weeks ago he went to hit a ball on the right and his left leg stayed planted as he rotated away from the left leg and he felt like he pulled something in the inner thigh to the front of the hip. He stopped playing pickleball for about a week, then tried to go back and play but was not able to play due to pain in the front of the left hip, lower abdominals, and inner left thigh. He also noticed some swelling in the lower abdomen as well. So for the past week he has just rested and used ice which has improved his pain. He reports pain mainly occurs when he tries to play pickleball and sometimes when he is walking around, if his foot gets stuck then he will get the  pain. He also notices the pain when he goes to roll in bed and the left leg moves first or opens up, and when getting out of the car when he has to lift and move his leg out. He states it has become more discomfort than pain. The pain tends to be sharp and quick, and then will go away once he repositions the leg. He did notice one instance while riding in a car for hours where he was having some pain in the right leg and on the outside of the right foot. He was working out everyday prior to the injury, where he was playing pickleball and doing core exercises.  PERTINENT HISTORY: See PMH above  PAIN:  Are you having pain? Yes:  NPRS scale: 0/10 currently, 1/10 at worst Pain location: Left anterior hip, lower abdominal, inner thigh Pain description: Sharp Aggravating factors: Movement of the  left hip, pickleball Relieving factors: Reposition the left leg out of painful position  PRECAUTIONS: None  PATIENT GOALS: Get back to exercise and pickleball   OBJECTIVE:  Note: Objective measures were completed at Evaluation unless otherwise noted. PATIENT SURVEYS:  LEFS: 50/80  07/16/2024: 77/80  MUSCLE LENGTH: Slight limitation with left hip flexor and adductor length  PALPATION: Patient reports tenderness to palpation of left proximal adductor region, hip flexor just medial to ASIS, TFL, deep palpation to hip flexor in lower abdominal region  LOWER EXTREMITY ROM:   Hip PROM grossly WFL and non painful  LOWER EXTREMITY MMT:  MMT Right eval Left eval Left 06/10/2024 Left 07/16/2024  Hip flexion 4+ 4 4+ 4+  Hip extension 4 4 4 4   Hip abduction 4+ 4 4 4+  Hip adduction      Hip internal rotation      Hip external rotation      Knee flexion 5 5    Knee extension 5 5    Ankle dorsiflexion      Ankle plantarflexion      Ankle inversion      Ankle eversion       (Blank rows = not tested)  FUNCTIONAL TESTS:  Squat: grossly WFL, no pain reported SL squat: patient exhibits decreased control and difficulty bilaterally, no pain reported DLLT: 30 deg without pain  GAIT: Assistive device utilized: None Level of assistance: Complete Independence Comments: grossly WFL                                                                                                                               TREATMENT  [facility] Adult PT Treatment:                                                DATE: 07/21/2024 Treadmill walking 3.6 mph at 2% incline x 5 min Lunge circuit x 5 each leg Vertical  squat jump 2 x 10 High knee jump 2 x 10 Lateral quick step with resistance at waist using L5 powerband 2 x 10 each Med ball lateral bound 2 x 10 each Modified side plank with reach x 5 each Tall plank with hand walk out 2 x 5 Side plank with elbow to knee crunch x 10 each Front plank with  opposite arm/leg extension 2 x 8 each KB swing with 20# x 12, single arm with 15# x 10 each  PATIENT EDUCATION:  Education details: HEP Person educated: Patient Education method: Programmer, Multimedia, Facilities Manager, Verbal cues Education comprehension: verbalized understanding, returned demonstration, verbal cues required, and needs further education  HOME EXERCISE PROGRAM: Access Code: [email or web address]    ASSESSMENT: CLINICAL IMPRESSION: Patient tolerated therapy well with no adverse effects. Therapy focused on progressing his power with plyometrics and core stabilization with good tolerance. He did request working on core exercises that don't require equipment as he will be traveling for the holidays and so worked on core stabilization strengthening with higher level progressions. He did not report any increased pain with therapy this visit and does report the exercises help him feel better and stronger. Patient would benefit from continued skilled PT to progress strength, power, and return to picklball in order to reduce pain and maximize functional ability.   Eval: Patient is a 53 y.o. male who was seen today for physical therapy evaluation and treatment for acute on chronic left hip, groin, and lower abdominal pain. His current symptoms seem to be consistent with left adductor and hip flexor related pain. He did not report any intraarticular hip related symptoms this visit. He does exhibit strength and flexibility deficits of the left hip that is likely contributing to his pain and impacting his functional ability to participate in exercise and pickleball.   OBJECTIVE IMPAIRMENTS: decreased activity tolerance, decreased strength, impaired flexibility, and pain.   ACTIVITY LIMITATIONS: lifting, squatting, sleeping, and locomotion level  PARTICIPATION LIMITATIONS: community activity  PERSONAL FACTORS: Time since onset of injury/illness/exacerbation are also affecting patient's functional outcome.     GOALS: Goals reviewed with patient? Yes  SHORT TERM GOALS: Target date: 06/19/2024  Patient will be I with initial HEP in order to progress with therapy. Baseline: HEP provided at eval 06/17/2024: independent Goal status: MET  2.  Patient will report left hip/thigh pain with activity and exercise </= 2/10 in order to reduce functional limitations Baseline: 5/10 06/17/2024: 2/10 Goal status: MET  LONG TERM GOALS: Target date: 08/27/2024  Patient will be I with final HEP to maintain progress from PT. Baseline: HEP provided at eval 07/16/2024: progressing Goal status: ONGOING  2.  Patient will report LEFS >/= 72/80/80 in order to indicate improvement in functional status. Baseline: 50/80 07/16/2024: 77/80 Goal status: MET  3.  Patient will demonstrate left hip strength >/= 4+/5 MMT in order to improve activity tolerance and confidence with activities such as pickleball Baseline: see limitations above 07/16/2024: see limitations above Goal status: ONGOING  4.  Patient will report return to activites such as pickleball and exercise without increased pain or limitation to indicate return to prior level of function Baseline: unable to participate in pickleball 07/16/2024: continuing to progress back to full participation in pickleball Goal status: ONGOING   PLAN: PT FREQUENCY: 1x/week  PT DURATION: 6 weeks  PLANNED INTERVENTIONS: 97164- PT Re-evaluation, 97750- Physical Performance Testing, 97110-Therapeutic exercises, 97530- Therapeutic activity, [id number]- Neuromuscular re-education, 97535- Self Care, 02859- Manual therapy, 20560 (1-2 muscles), 20561 (3+ muscles)- Dry Needling, Patient/Family  education, Balance training, Joint mobilization, Joint manipulation, Spinal manipulation, Spinal mobilization, Cryotherapy, and Moist heat  PLAN FOR NEXT SESSION: Review HEP and progress PRN, manual/TPDN for the left hip and thigh region, initiate flexibility for the left hip and thigh,  progress hip and core strengthening as tolerated, initiate more dynamic functional movements to improve tolerance for activity   Elaine Daring, PT, DPT, LAT, ATC 07/21/2024  12:51 PM Phone: [phone] Fax: [phone]   "

## 2024-08-05 ENCOUNTER — Encounter: Payer: Self-pay | Admitting: Physical Therapy

## 2024-08-05 ENCOUNTER — Other Ambulatory Visit: Payer: Self-pay

## 2024-08-05 ENCOUNTER — Ambulatory Visit: Admitting: Physical Therapy

## 2024-08-05 DIAGNOSIS — M79652 Pain in left thigh: Secondary | ICD-10-CM

## 2024-08-05 DIAGNOSIS — M6281 Muscle weakness (generalized): Secondary | ICD-10-CM | POA: Diagnosis not present

## 2024-08-05 DIAGNOSIS — M25552 Pain in left hip: Secondary | ICD-10-CM

## 2024-08-05 NOTE — Therapy (Signed)
 " OUTPATIENT PHYSICAL THERAPY TREATMENT   Patient Name: Benjamin Ho MRN: [id number] DOB:Mar 04, 1971, 54 y.o., male Today's Date: 08/05/2024   END OF SESSION:  PT End of Session - 08/05/24 0808     Visit Number 10    Number of Visits 14    Date for Recertification  08/27/24    Authorization Type [employer]    PT Start Time 0805    PT Stop Time 0850    PT Time Calculation (min) 45 min    Activity Tolerance Patient tolerated treatment well    Behavior During Therapy [facility] for tasks assessed/performed                   Past Medical History:  Diagnosis Date   Allergy    per pt   Anxiety    GERD (gastroesophageal reflux disease)    per pt   Past Surgical History:  Procedure Laterality Date   CHOLECYSTECTOMY N/A 08/29/2022   Procedure: LAPAROSCOPIC CHOLECYSTECTOMY;  Surgeon: Tanda Locus, MD;  Location: [facility];  Service: General;  Laterality: N/A;   EUS N/A 04/26/2020   Procedure: LOWER ENDOSCOPIC ULTRASOUND (EUS);  Surgeon: Wilhelmenia Aloha Raddle., MD;  Location: [facility] ENDOSCOPY;  Service: Gastroenterology;  Laterality: N/A;   FINGER SURGERY     FLEXIBLE SIGMOIDOSCOPY N/A 04/26/2020   Procedure: FLEXIBLE SIGMOIDOSCOPY;  Surgeon: Wilhelmenia Aloha Raddle., MD;  Location: [facility] ENDOSCOPY;  Service: Gastroenterology;  Laterality: N/A;   mallet finger     pinky right hand   UPPER GASTROINTESTINAL ENDOSCOPY     8-10 yrs ago per pt.   WISDOM TOOTH EXTRACTION Bilateral 2022   Patient Active Problem List   Diagnosis Date Noted   Strain of left calf muscle 05/10/2023    PCP: Valentin Skates, DO  REFERRING PROVIDER: Joane Artist RAMAN, MD  REFERRING DIAG: Left thigh pain; Left hip pain  THERAPY DIAG:  Pain in left hip  Pain in left thigh  Muscle weakness (generalized)  Rationale for Evaluation and Treatment: Rehabilitation  ONSET DATE: Chronic, worsened 3 weeks ago from eval   SUBJECTIVE:  SUBJECTIVE STATEMENT: Patient reports his hip is feeling fine but did  wake up with a knot or tightness around the left mid/upper back. Patient reports he did play pickleball recently and did feel something on the outside of the right hip after he was done playing.  Eval: Patient reports about the beginning of the summer he was doing something at home and he had to take a big step up and felt some pain in the front of the hip. He states he was not having that sensation all the time but then he was going up the stairs and his foot got stuck and he felt like he pulled a muscle in the left thigh so he did some exercises from youtube and it felt better. Then about 3 weeks ago he went to hit a ball on the right and his left leg stayed planted as he rotated away from the left leg and he felt like he pulled something in the inner thigh to the front of the hip. He stopped playing pickleball for about a week, then tried to go back and play but was not able to play due to pain in the front of the left hip, lower abdominals, and inner left thigh. He also noticed some swelling in the lower abdomen as well. So for the past week he has just rested and used ice which has improved his pain. He reports  pain mainly occurs when he tries to play pickleball and sometimes when he is walking around, if his foot gets stuck then he will get the pain. He also notices the pain when he goes to roll in bed and the left leg moves first or opens up, and when getting out of the car when he has to lift and move his leg out. He states it has become more discomfort than pain. The pain tends to be sharp and quick, and then will go away once he repositions the leg. He did notice one instance while riding in a car for hours where he was having some pain in the right leg and on the outside of the right foot. He was working out everyday prior to the injury, where he was playing pickleball and doing core exercises.  PERTINENT HISTORY: See PMH above  PAIN:  Are you having pain? Yes:  NPRS scale: 0/10 currently, 1/10  at worst Pain location: Left anterior hip, lower abdominal, inner thigh Pain description: Sharp Aggravating factors: Movement of the left hip, pickleball Relieving factors: Reposition the left leg out of painful position  PRECAUTIONS: None  PATIENT GOALS: Get back to exercise and pickleball   OBJECTIVE:  Note: Objective measures were completed at Evaluation unless otherwise noted. PATIENT SURVEYS:  LEFS: 50/80  07/16/2024: 77/80  MUSCLE LENGTH: Slight limitation with left hip flexor and adductor length  PALPATION: Patient reports tenderness to palpation of left proximal adductor region, hip flexor just medial to ASIS, TFL, deep palpation to hip flexor in lower abdominal region  LOWER EXTREMITY ROM:   Hip PROM grossly WFL and non painful  LOWER EXTREMITY MMT:  MMT Right eval Left eval Left 06/10/2024 Left 07/16/2024  Hip flexion 4+ 4 4+ 4+  Hip extension 4 4 4 4   Hip abduction 4+ 4 4 4+  Hip adduction      Hip internal rotation      Hip external rotation      Knee flexion 5 5    Knee extension 5 5    Ankle dorsiflexion      Ankle plantarflexion      Ankle inversion      Ankle eversion       (Blank rows = not tested)  FUNCTIONAL TESTS:  Squat: grossly WFL, no pain reported SL squat: patient exhibits decreased control and difficulty bilaterally, no pain reported DLLT: 30 deg without pain  GAIT: Assistive device utilized: None Level of assistance: Complete Independence Comments: grossly WFL                                                                                                                               TREATMENT  [facility] Adult PT Treatment:  DATE: 08/05/2024 Treadmill walking 3.6 mph at 2% incline x 4 min Rear foot elevated SL vertical hop 2 x 20 SL fwd/bwd line hop x 20 sec each SL lateral line hops x 20 sec each SL box hops x 5 cw/ccw each Vertical squat jump 2 x 10 SL triple hop x 3 each Bulgarian  split squat with 20# KB in rack position on contralateral side 3 x 8 Tall plank with KB pull through using 15# 2 x 10 KB swing with 20# x 10, 35# x 10, 50# 2 x 10  PATIENT EDUCATION:  Education details: HEP Person educated: Patient Education method: Programmer, Multimedia, Facilities Manager, Verbal cues Education comprehension: verbalized understanding, returned demonstration, verbal cues required, and needs further education  HOME EXERCISE PROGRAM: Access Code: [email or web address]    ASSESSMENT: CLINICAL IMPRESSION: Patient tolerated therapy well with no adverse effects. Therapy continues to focus on progress his strength, power, and control with good tolerance. He does exhibit limitations with more dynamic plyometrics on the left consisting of the the box hop and triple hop. He does seem to have more hesitation/fear when performing higher level single leg plyometric exercises. Continued working on strengthening and power progressions with good tolerance. He did not report any increased pain with his left hip in therapy. No changes to his HEP but discussed progressions of core and LE strengthening. Patient would benefit from continued skilled PT to progress strength, power, and return to picklball in order to reduce pain and maximize functional ability.   Eval: Patient is a 54 y.o. male who was seen today for physical therapy evaluation and treatment for acute on chronic left hip, groin, and lower abdominal pain. His current symptoms seem to be consistent with left adductor and hip flexor related pain. He did not report any intraarticular hip related symptoms this visit. He does exhibit strength and flexibility deficits of the left hip that is likely contributing to his pain and impacting his functional ability to participate in exercise and pickleball.   OBJECTIVE IMPAIRMENTS: decreased activity tolerance, decreased strength, impaired flexibility, and pain.   ACTIVITY LIMITATIONS: lifting, squatting, sleeping, and  locomotion level  PARTICIPATION LIMITATIONS: community activity  PERSONAL FACTORS: Time since onset of injury/illness/exacerbation are also affecting patient's functional outcome.    GOALS: Goals reviewed with patient? Yes  SHORT TERM GOALS: Target date: 06/19/2024  Patient will be I with initial HEP in order to progress with therapy. Baseline: HEP provided at eval 06/17/2024: independent Goal status: MET  2.  Patient will report left hip/thigh pain with activity and exercise </= 2/10 in order to reduce functional limitations Baseline: 5/10 06/17/2024: 2/10 Goal status: MET  LONG TERM GOALS: Target date: 08/27/2024  Patient will be I with final HEP to maintain progress from PT. Baseline: HEP provided at eval 07/16/2024: progressing Goal status: ONGOING  2.  Patient will report LEFS >/= 72/80/80 in order to indicate improvement in functional status. Baseline: 50/80 07/16/2024: 77/80 Goal status: MET  3.  Patient will demonstrate left hip strength >/= 4+/5 MMT in order to improve activity tolerance and confidence with activities such as pickleball Baseline: see limitations above 07/16/2024: see limitations above Goal status: ONGOING  4.  Patient will report return to activites such as pickleball and exercise without increased pain or limitation to indicate return to prior level of function Baseline: unable to participate in pickleball 07/16/2024: continuing to progress back to full participation in pickleball Goal status: ONGOING   PLAN: PT FREQUENCY: 1x/week  PT DURATION: 6 weeks  PLANNED  INTERVENTIONS: 02835- PT Re-evaluation, 97750- Physical Performance Testing, 97110-Therapeutic exercises, 97530- Therapeutic activity, 97112- Neuromuscular re-education, [phone]- Self Care, 02859- Manual therapy, 20560 (1-2 muscles), 20561 (3+ muscles)- Dry Needling, Patient/Family education, Balance training, Joint mobilization, Joint manipulation, Spinal manipulation, Spinal  mobilization, Cryotherapy, and Moist heat  PLAN FOR NEXT SESSION: Review HEP and progress PRN, manual/TPDN for the left hip and thigh region, initiate flexibility for the left hip and thigh, progress hip and core strengthening as tolerated, initiate more dynamic functional movements to improve tolerance for activity   Elaine Daring, PT, DPT, LAT, ATC 08/05/2024  9:24 AM Phone: [phone] Fax: [phone]   "

## 2024-08-12 ENCOUNTER — Ambulatory Visit: Admitting: Physical Therapy

## 2024-08-12 ENCOUNTER — Other Ambulatory Visit: Payer: Self-pay

## 2024-08-12 ENCOUNTER — Encounter: Payer: Self-pay | Admitting: Physical Therapy

## 2024-08-12 DIAGNOSIS — M6281 Muscle weakness (generalized): Secondary | ICD-10-CM | POA: Diagnosis not present

## 2024-08-12 DIAGNOSIS — M79652 Pain in left thigh: Secondary | ICD-10-CM

## 2024-08-12 DIAGNOSIS — M25552 Pain in left hip: Secondary | ICD-10-CM

## 2024-08-12 NOTE — Therapy (Signed)
 " OUTPATIENT PHYSICAL THERAPY TREATMENT   Patient Name: Benjamin Ho MRN: [id number] DOB:21-Jan-1971, 54 y.o., male Today's Date: 08/12/2024   END OF SESSION:  PT End of Session - 08/12/24 0754     Visit Number 11    Number of Visits 17    Date for Recertification  09/23/24    Authorization Type [employer]    PT Start Time 0800    PT Stop Time 0845    PT Time Calculation (min) 45 min    Activity Tolerance Patient tolerated treatment well    Behavior During Therapy [facility] for tasks assessed/performed                    Past Medical History:  Diagnosis Date   Allergy    per pt   Anxiety    GERD (gastroesophageal reflux disease)    per pt   Past Surgical History:  Procedure Laterality Date   CHOLECYSTECTOMY N/A 08/29/2022   Procedure: LAPAROSCOPIC CHOLECYSTECTOMY;  Surgeon: Tanda Locus, MD;  Location: [facility]S;  Service: General;  Laterality: N/A;   EUS N/A 04/26/2020   Procedure: LOWER ENDOSCOPIC ULTRASOUND (EUS);  Surgeon: Wilhelmenia Aloha Raddle., MD;  Location: [facility] ENDOSCOPY;  Service: Gastroenterology;  Laterality: N/A;   FINGER SURGERY     FLEXIBLE SIGMOIDOSCOPY N/A 04/26/2020   Procedure: FLEXIBLE SIGMOIDOSCOPY;  Surgeon: Wilhelmenia Aloha Raddle., MD;  Location: [facility] ENDOSCOPY;  Service: Gastroenterology;  Laterality: N/A;   mallet finger     pinky right hand   UPPER GASTROINTESTINAL ENDOSCOPY     8-10 yrs ago per pt.   WISDOM TOOTH EXTRACTION Bilateral 2022   Patient Active Problem List   Diagnosis Date Noted   Strain of left calf muscle 05/10/2023    PCP: Valentin Skates, DO  REFERRING PROVIDER: Joane Artist RAMAN, MD  REFERRING DIAG: Left thigh pain; Left hip pain  THERAPY DIAG:  Pain in left hip  Pain in left thigh  Muscle weakness (generalized)  Rationale for Evaluation and Treatment: Rehabilitation  ONSET DATE: Chronic, worsened 3 weeks ago from eval   SUBJECTIVE:  SUBJECTIVE STATEMENT: Patient reports his left hip feels tight and  like it is locked up. He has also noticed some slight pain in the front of his left thigh.   Eval: Patient reports about the beginning of the summer he was doing something at home and he had to take a big step up and felt some pain in the front of the hip. He states he was not having that sensation all the time but then he was going up the stairs and his foot got stuck and he felt like he pulled a muscle in the left thigh so he did some exercises from youtube and it felt better. Then about 3 weeks ago he went to hit a ball on the right and his left leg stayed planted as he rotated away from the left leg and he felt like he pulled something in the inner thigh to the front of the hip. He stopped playing pickleball for about a week, then tried to go back and play but was not able to play due to pain in the front of the left hip, lower abdominals, and inner left thigh. He also noticed some swelling in the lower abdomen as well. So for the past week he has just rested and used ice which has improved his pain. He reports pain mainly occurs when he tries to play pickleball and sometimes when he is walking  around, if his foot gets stuck then he will get the pain. He also notices the pain when he goes to roll in bed and the left leg moves first or opens up, and when getting out of the car when he has to lift and move his leg out. He states it has become more discomfort than pain. The pain tends to be sharp and quick, and then will go away once he repositions the leg. He did notice one instance while riding in a car for hours where he was having some pain in the right leg and on the outside of the right foot. He was working out everyday prior to the injury, where he was playing pickleball and doing core exercises.  PERTINENT HISTORY: See PMH above  PAIN:  Are you having pain? Yes:  NPRS scale: 0/10 currently, 1/10 at worst Pain location: Left anterior hip, lower abdominal, inner thigh Pain description:  Sharp Aggravating factors: Movement of the left hip, pickleball Relieving factors: Reposition the left leg out of painful position  PRECAUTIONS: None  PATIENT GOALS: Get back to exercise and pickleball   OBJECTIVE:  Note: Objective measures were completed at Evaluation unless otherwise noted. PATIENT SURVEYS:  LEFS: 50/80  07/16/2024: 77/80  MUSCLE LENGTH: Slight limitation with left hip flexor and adductor length  PALPATION: Patient reports tenderness to palpation of left proximal adductor region, hip flexor just medial to ASIS, TFL, deep palpation to hip flexor in lower abdominal region  LOWER EXTREMITY ROM:   Hip PROM grossly WFL and non painful  LOWER EXTREMITY MMT:  MMT Right eval Left eval Left 06/10/2024 Left 07/16/2024 Left 08/12/2024  Hip flexion 4+ 4 4+ 4+   Hip extension 4 4 4 4 4   Hip abduction 4+ 4 4 4+ 4+  Hip adduction       Hip internal rotation       Hip external rotation       Knee flexion 5 5     Knee extension 5 5     Ankle dorsiflexion       Ankle plantarflexion       Ankle inversion       Ankle eversion        (Blank rows = not tested)  FUNCTIONAL TESTS:  Squat: grossly WFL, no pain reported SL squat: patient exhibits decreased control and difficulty bilaterally, no pain reported DLLT: 30 deg without pain  GAIT: Assistive device utilized: None Level of assistance: Complete Independence Comments: grossly WFL                                                                                                                               TREATMENT  [facility] Adult PT Treatment:  DATE: 08/12/2024 Side clamshell with green and ball between feet 2 x 15 left Side reverse clamshell with 2# and ball between knees 2 x 15 left Eccentric hip flexor in thomas position with 5# x 10 left Hip airplane with FR at medial knee x 8 left Attempted SL RDL holding med ball but unable to perform due to balance Rear foot  elevated RDL with 30# 3 x 8 each Longsitting hip flexor hurdles over KB 2 x 10 each  PATIENT EDUCATION:  Education details: HEP update Person educated: Patient Education method: Programmer, Multimedia, Demonstration, Verbal cues, Handout Education comprehension: verbalized understanding, returned demonstration, verbal cues required, and needs further education  HOME EXERCISE PROGRAM: Access Code: [email or web address]    ASSESSMENT: CLINICAL IMPRESSION: Patient tolerated therapy well with no adverse effects. He arrives reports tightness of left hip region and therapy focused on more active mobility and hip control exercises with good tolerance and patient reporting improvement in tightness following therapy. He did have more difficulty with his balance bilaterally this visit. Patient did report a slight discomfort at the mid-proximal anterior/medial thigh so worked on some core and hip flexor strength and control exercises. Updated his HEP to include hip  control and mobility exercises. Patient would benefit from continued skilled PT to progress strength, power, and return to picklball in order to reduce pain and maximize functional ability, so will extend PT POC for 6 more weeks.   Eval: Patient is a 54 y.o. male who was seen today for physical therapy evaluation and treatment for acute on chronic left hip, groin, and lower abdominal pain. His current symptoms seem to be consistent with left adductor and hip flexor related pain. He did not report any intraarticular hip related symptoms this visit. He does exhibit strength and flexibility deficits of the left hip that is likely contributing to his pain and impacting his functional ability to participate in exercise and pickleball.   OBJECTIVE IMPAIRMENTS: decreased activity tolerance, decreased strength, impaired flexibility, and pain.   ACTIVITY LIMITATIONS: lifting, squatting, sleeping, and locomotion level  PARTICIPATION LIMITATIONS: community  activity  PERSONAL FACTORS: Time since onset of injury/illness/exacerbation are also affecting patient's functional outcome.    GOALS: Goals reviewed with patient? Yes  SHORT TERM GOALS: Target date: 06/19/2024  Patient will be I with initial HEP in order to progress with therapy. Baseline: HEP provided at eval 06/17/2024: independent Goal status: MET  2.  Patient will report left hip/thigh pain with activity and exercise </= 2/10 in order to reduce functional limitations Baseline: 5/10 06/17/2024: 2/10 Goal status: MET  LONG TERM GOALS: Target date: 09/23/2024  Patient will be I with final HEP to maintain progress from PT. Baseline: HEP provided at eval 07/16/2024: progressing 08/12/2024: progressing Goal status: ONGOING  2.  Patient will report LEFS >/= 72/80/80 in order to indicate improvement in functional status. Baseline: 50/80 07/16/2024: 77/80 Goal status: MET  3.  Patient will demonstrate left hip strength >/= 4+/5 MMT in order to improve activity tolerance and confidence with activities such as pickleball Baseline: see limitations above 07/16/2024: see limitations above 08/12/2024: see limitations above  Goal status: ONGOING  4.  Patient will report return to activites such as pickleball and exercise without increased pain or limitation to indicate return to prior level of function Baseline: unable to participate in pickleball 07/16/2024: continuing to progress back to full participation in pickleball 08/12/2024: continuing to progress back to full participation in pickleball Goal status: ONGOING   PLAN: PT FREQUENCY: 1x/week  PT DURATION:  6 weeks  PLANNED INTERVENTIONS: 97164- PT Re-evaluation, 97750- Physical Performance Testing, 97110-Therapeutic exercises, 97530- Therapeutic activity, 97112- Neuromuscular re-education, [phone]- Self Care, 02859- Manual therapy, 20560 (1-2 muscles), 20561 (3+ muscles)- Dry Needling, Patient/Family education, Balance training,  Joint mobilization, Joint manipulation, Spinal manipulation, Spinal mobilization, Cryotherapy, and Moist heat  PLAN FOR NEXT SESSION: Review HEP and progress PRN, manual/TPDN for the left hip and thigh region, initiate flexibility for the left hip and thigh, progress hip and core strengthening as tolerated, initiate more dynamic functional movements to improve tolerance for activity   Elaine Daring, PT, DPT, LAT, ATC 08/12/2024  11:01 AM Phone: [phone] Fax: [phone]   "

## 2024-08-19 ENCOUNTER — Ambulatory Visit: Admitting: Physical Therapy

## 2024-08-19 ENCOUNTER — Other Ambulatory Visit: Payer: Self-pay

## 2024-08-19 ENCOUNTER — Encounter: Payer: Self-pay | Admitting: Physical Therapy

## 2024-08-19 DIAGNOSIS — M79652 Pain in left thigh: Secondary | ICD-10-CM | POA: Diagnosis not present

## 2024-08-19 DIAGNOSIS — M25552 Pain in left hip: Secondary | ICD-10-CM

## 2024-08-19 DIAGNOSIS — M6281 Muscle weakness (generalized): Secondary | ICD-10-CM | POA: Diagnosis not present

## 2024-08-19 NOTE — Therapy (Signed)
 " OUTPATIENT PHYSICAL THERAPY TREATMENT   Patient Name: Benjamin Ho MRN: [id number] DOB:[birth year], 54 y.o., male Today's Date: 08/19/2024   END OF SESSION:  PT End of Session - 08/19/24 0805     Visit Number 12    Number of Visits 17    Date for Recertification  09/23/24    Authorization Type [employer]    PT Start Time 0800    PT Stop Time 0845    PT Time Calculation (min) 45 min    Activity Tolerance Patient tolerated treatment well    Behavior During Therapy [facility] for tasks assessed/performed                     Past Medical History:  Diagnosis Date   Allergy    per pt   Anxiety    GERD (gastroesophageal reflux disease)    per pt   Past Surgical History:  Procedure Laterality Date   CHOLECYSTECTOMY N/A 08/29/2022   Procedure: LAPAROSCOPIC CHOLECYSTECTOMY;  Surgeon: Tanda Locus, MD;  Location: [facility] ORS;  Service: General;  Laterality: N/A;   EUS N/A 04/26/2020   Procedure: LOWER ENDOSCOPIC ULTRASOUND (EUS);  Surgeon: Wilhelmenia Aloha Raddle., MD;  Location: [facility] ENDOSCOPY;  Service: Gastroenterology;  Laterality: N/A;   FINGER SURGERY     FLEXIBLE SIGMOIDOSCOPY N/A 04/26/2020   Procedure: FLEXIBLE SIGMOIDOSCOPY;  Surgeon: Wilhelmenia Aloha Raddle., MD;  Location: [facility] ENDOSCOPY;  Service: Gastroenterology;  Laterality: N/A;   mallet finger     pinky right hand   UPPER GASTROINTESTINAL ENDOSCOPY     8-10 yrs ago per pt.   WISDOM TOOTH EXTRACTION Bilateral 2022   Patient Active Problem List   Diagnosis Date Noted   Strain of left calf muscle 05/10/2023    PCP: Valentin Skates, DO  REFERRING PROVIDER: Joane Artist RAMAN, MD  REFERRING DIAG: Left thigh pain; Left hip pain  THERAPY DIAG:  Pain in left hip  Pain in left thigh  Muscle weakness (generalized)  Rationale for Evaluation and Treatment: Rehabilitation  ONSET DATE: Chronic, worsened 3 weeks ago from eval   SUBJECTIVE:  SUBJECTIVE STATEMENT: Patient reports he played pickleball this  weekend without issue and is feeling well today. He states this past week has been better. He reports that he is 85-90% back to full pickleball.   Eval: Patient reports about the beginning of the summer he was doing something at home and he had to take a big step up and felt some pain in the front of the hip. He states he was not having that sensation all the time but then he was going up the stairs and his foot got stuck and he felt like he pulled a muscle in the left thigh so he did some exercises from youtube and it felt better. Then about 3 weeks ago he went to hit a ball on the right and his left leg stayed planted as he rotated away from the left leg and he felt like he pulled something in the inner thigh to the front of the hip. He stopped playing pickleball for about a week, then tried to go back and play but was not able to play due to pain in the front of the left hip, lower abdominals, and inner left thigh. He also noticed some swelling in the lower abdomen as well. So for the past week he has just rested and used ice which has improved his pain. He reports pain mainly occurs when he tries to play pickleball  and sometimes when he is walking around, if his foot gets stuck then he will get the pain. He also notices the pain when he goes to roll in bed and the left leg moves first or opens up, and when getting out of the car when he has to lift and move his leg out. He states it has become more discomfort than pain. The pain tends to be sharp and quick, and then will go away once he repositions the leg. He did notice one instance while riding in a car for hours where he was having some pain in the right leg and on the outside of the right foot. He was working out everyday prior to the injury, where he was playing pickleball and doing core exercises.  PERTINENT HISTORY: See PMH above  PAIN:  Are you having pain? Yes:  NPRS scale: 0/10 currently, 1/10 at worst Pain location: Left anterior hip, lower  abdominal, inner thigh Pain description: Sharp Aggravating factors: Movement of the left hip, pickleball Relieving factors: Reposition the left leg out of painful position  PRECAUTIONS: None  PATIENT GOALS: Get back to exercise and pickleball   OBJECTIVE:  Note: Objective measures were completed at Evaluation unless otherwise noted. PATIENT SURVEYS:  LEFS: 50/80  07/16/2024: 77/80  MUSCLE LENGTH: Slight limitation with left hip flexor and adductor length  PALPATION: Patient reports tenderness to palpation of left proximal adductor region, hip flexor just medial to ASIS, TFL, deep palpation to hip flexor in lower abdominal region  LOWER EXTREMITY ROM:   Hip PROM grossly WFL and non painful  LOWER EXTREMITY MMT:  MMT Right eval Left eval Left 06/10/2024 Left 07/16/2024 Left 08/12/2024  Hip flexion 4+ 4 4+ 4+   Hip extension 4 4 4 4 4   Hip abduction 4+ 4 4 4+ 4+  Hip adduction       Hip internal rotation       Hip external rotation       Knee flexion 5 5     Knee extension 5 5     Ankle dorsiflexion       Ankle plantarflexion       Ankle inversion       Ankle eversion        (Blank rows = not tested)  FUNCTIONAL TESTS:  Squat: grossly WFL, no pain reported SL squat: patient exhibits decreased control and difficulty bilaterally, no pain reported DLLT: 30 deg without pain  GAIT: Assistive device utilized: None Level of assistance: Complete Independence Comments: grossly WFL                                                                                                                               TREATMENT  [facility] Adult PT Treatment:  DATE: 08/19/2024 Rear foot elevated vertical hop x 20 each Rear foot elevated lateral line hop x 20 each Vertical squat jump x 10 Tuck jump 2 x 10 180 deg jump x 10 Forward and lateral drop landing from 14 box x 5 each Various agility ladder drills  PATIENT EDUCATION:  Education  details: HEP Person educated: Patient Education method: Programmer, Multimedia, Facilities Manager, Verbal cues Education comprehension: verbalized understanding, returned demonstration, verbal cues required, and needs further education  HOME EXERCISE PROGRAM: Access Code: [email or web address]    ASSESSMENT: CLINICAL IMPRESSION: Patient tolerated therapy well with no adverse effects. Therapy focused on progressing plyometric and agility exercises with good tolerance. He does continue to report greater difficulty and more of a mental block with SL plyometric or more explosive jump/landing tasks on the left. He does not report any pain with therapy. Patient would benefit from continued skilled PT to progress strength, power, and return to pickleball in order to reduce pain and maximize functional ability.   Eval: Patient is a 54 y.o. male who was seen today for physical therapy evaluation and treatment for acute on chronic left hip, groin, and lower abdominal pain. His current symptoms seem to be consistent with left adductor and hip flexor related pain. He did not report any intraarticular hip related symptoms this visit. He does exhibit strength and flexibility deficits of the left hip that is likely contributing to his pain and impacting his functional ability to participate in exercise and pickleball.   OBJECTIVE IMPAIRMENTS: decreased activity tolerance, decreased strength, impaired flexibility, and pain.   ACTIVITY LIMITATIONS: lifting, squatting, sleeping, and locomotion level  PARTICIPATION LIMITATIONS: community activity  PERSONAL FACTORS: Time since onset of injury/illness/exacerbation are also affecting patient's functional outcome.    GOALS: Goals reviewed with patient? Yes  SHORT TERM GOALS: Target date: 06/19/2024  Patient will be I with initial HEP in order to progress with therapy. Baseline: HEP provided at eval 06/17/2024: independent Goal status: MET  2.  Patient will report left hip/thigh  pain with activity and exercise </= 2/10 in order to reduce functional limitations Baseline: 5/10 06/17/2024: 2/10 Goal status: MET  LONG TERM GOALS: Target date: 09/23/2024  Patient will be I with final HEP to maintain progress from PT. Baseline: HEP provided at eval 07/16/2024: progressing 08/12/2024: progressing Goal status: ONGOING  2.  Patient will report LEFS >/= 72/80/80 in order to indicate improvement in functional status. Baseline: 50/80 07/16/2024: 77/80 Goal status: MET  3.  Patient will demonstrate left hip strength >/= 4+/5 MMT in order to improve activity tolerance and confidence with activities such as pickleball Baseline: see limitations above 07/16/2024: see limitations above 08/12/2024: see limitations above  Goal status: ONGOING  4.  Patient will report return to activites such as pickleball and exercise without increased pain or limitation to indicate return to prior level of function Baseline: unable to participate in pickleball 07/16/2024: continuing to progress back to full participation in pickleball 08/12/2024: continuing to progress back to full participation in pickleball Goal status: ONGOING   PLAN: PT FREQUENCY: 1x/week  PT DURATION: 6 weeks  PLANNED INTERVENTIONS: 97164- PT Re-evaluation, 97750- Physical Performance Testing, 97110-Therapeutic exercises, 97530- Therapeutic activity, 97112- Neuromuscular re-education, 97535- Self Care, 02859- Manual therapy, 20560 (1-2 muscles), 20561 (3+ muscles)- Dry Needling, Patient/Family education, Balance training, Joint mobilization, Joint manipulation, Spinal manipulation, Spinal mobilization, Cryotherapy, and Moist heat  PLAN FOR NEXT SESSION: Review HEP and progress PRN, manual/TPDN for the left hip and thigh region, initiate flexibility for the left hip and  thigh, progress hip and core strengthening as tolerated, initiate more dynamic functional movements to improve tolerance for activity   Elaine Daring,  PT, DPT, LAT, ATC 08/19/24  8:47 AM Phone: [phone] Fax: [phone]   "

## 2024-08-26 ENCOUNTER — Encounter: Admitting: Physical Therapy

## 2024-09-11 ENCOUNTER — Encounter: Admitting: Physical Therapy
